# Patient Record
Sex: Female | Born: 1990 | Race: White | Hispanic: Yes | Marital: Single | Smoking: Never smoker
Health system: Southern US, Community
[De-identification: ages and names within clinical notes are randomized; demographics above are authoritative.]

## PROBLEM LIST (undated history)

## (undated) DIAGNOSIS — M1990 Unspecified osteoarthritis, unspecified site: Secondary | ICD-10-CM

## (undated) DIAGNOSIS — F909 Attention-deficit hyperactivity disorder, unspecified type: Secondary | ICD-10-CM

## (undated) DIAGNOSIS — F988 Other specified behavioral and emotional disorders with onset usually occurring in childhood and adolescence: Secondary | ICD-10-CM

## (undated) DIAGNOSIS — Z87820 Personal history of traumatic brain injury: Secondary | ICD-10-CM

## (undated) DIAGNOSIS — N920 Excessive and frequent menstruation with regular cycle: Secondary | ICD-10-CM

## (undated) DIAGNOSIS — N946 Dysmenorrhea, unspecified: Secondary | ICD-10-CM

## (undated) HISTORY — DX: Other specified behavioral and emotional disorders with onset usually occurring in childhood and adolescence: F98.8

---

## 2010-08-21 HISTORY — PX: WISDOM TOOTH EXTRACTION: SHX21

## 2013-08-26 ENCOUNTER — Encounter (HOSPITAL_COMMUNITY): Payer: Self-pay | Admitting: Emergency Medicine

## 2013-08-26 ENCOUNTER — Emergency Department (HOSPITAL_COMMUNITY)
Admission: EM | Admit: 2013-08-26 | Discharge: 2013-08-26 | Disposition: A | Discharge status: Home / Self Care | Payer: 59 | Attending: Emergency Medicine | Admitting: Emergency Medicine

## 2013-08-26 DIAGNOSIS — W268XXA Contact with other sharp object(s), not elsewhere classified, initial encounter: Secondary | ICD-10-CM | POA: Insufficient documentation

## 2013-08-26 DIAGNOSIS — Y929 Unspecified place or not applicable: Secondary | ICD-10-CM | POA: Diagnosis not present

## 2013-08-26 DIAGNOSIS — Y9389 Activity, other specified: Secondary | ICD-10-CM | POA: Insufficient documentation

## 2013-08-26 DIAGNOSIS — T1500XA Foreign body in cornea, unspecified eye, initial encounter: Secondary | ICD-10-CM | POA: Diagnosis present

## 2013-08-26 DIAGNOSIS — T1501XA Foreign body in cornea, right eye, initial encounter: Secondary | ICD-10-CM

## 2013-08-26 MED ORDER — TETRACAINE HCL 0.5 % OP SOLN
1.0000 [drp] | Freq: Once | OPHTHALMIC | Status: DC
Start: 2013-08-26 — End: 2013-08-26
  Filled 2013-08-26: qty 2

## 2013-08-26 MED ORDER — FLUORESCEIN SODIUM 1 MG OP STRP: 1.0000 | ORAL_STRIP | Freq: Once | OPHTHALMIC | Status: DC

## 2013-08-26 NOTE — ED Provider Notes (Signed)
CSN: [id number]     Arrival date & time 08/26/13  0240 History   First MD Initiated Contact with Patient 08/26/13 [phone]     Chief Complaint  Patient presents with  . Foreign Body in Eye   (Consider location/radiation/quality/duration/timing/severity/associated sxs/prior Treatment) HPI Comments: Patient presents with complaint of foreign body in left eye. Patient was grinding metal at approximately 1 AM when she had acute onset of foreign body sensation. Patient rinsed her eye with water several times but FB did not dislodge. Complaints of pain and tearing. It is hard for her to see due to the tearing. The onset of this condition was acute. The course is constant. Aggravating factors: none. Alleviating factors: none.    The history is provided by the patient.    History reviewed. No pertinent past medical history. History reviewed. No pertinent past surgical history. No family history on file. History  Substance Use Topics  . Smoking status: Never Smoker   . Smokeless tobacco: Not on file  . Alcohol Use: No   OB History   No data available     Review of Systems  Constitutional: Negative for fever.  Eyes: Positive for photophobia, pain and discharge. Negative for redness and visual disturbance.  Gastrointestinal: Negative for nausea and vomiting.    Allergies  Review of patient's allergies indicates no known allergies.  Home Medications   Current Outpatient Rx  Name  Route  Sig  Dispense  Refill  . Levonorgestrel-Ethinyl Estradiol (SEASONIQUE) 0.15-0.03 &0.01 MG tablet   Oral   Take 1 tablet by mouth daily.          BP 119/73  Pulse 77  Temp(Src) 97.8 F (36.6 C) (Oral)  Resp 20  Ht 5\' 2"  (1.575 m)  Wt 135 lb (61.236 kg)  BMI 24.69 kg/m2  SpO2 100% Physical Exam  Nursing note and vitals reviewed. Constitutional: She appears well-developed and well-nourished.  HENT:  Head: Normocephalic and atraumatic.  Eyes: Lids are normal. Pupils are equal, round, and  reactive to light. Right eye exhibits no discharge. Left eye exhibits no discharge. Right conjunctiva is injected. Left conjunctiva is not injected. Right eye exhibits normal extraocular motion.  Slit lamp exam:      The right eye shows corneal abrasion, foreign body and fluorescein uptake.       The left eye shows no foreign body and no fluorescein uptake.    Neck: Normal range of motion. Neck supple.  Pulmonary/Chest: No respiratory distress.  Neurological: She is alert.  Skin: Skin is warm and dry.  Psychiatric: She has a normal mood and affect.    ED Course  Procedures (including critical care time) Labs Review Labs Reviewed - No data to display Imaging Review No results found.  EKG Interpretation   None      7:03 AM Patient seen and examined. Medications ordered.   Vital signs reviewed and are as follows: Filed Vitals:   08/26/13 0320  BP: 119/73  Pulse: 77  Temp: 97.8 F (36.6 C)  Resp: 20   Unable to dislodge FB with insulin needle with Wood's Lamp. Will call ophthalmology to obtain follow-up appointment.   8:01 AM Spoke with Dr. Burgess Estelle who will see patient in office today. Pt to call office at 8:30AM.   Patient verbalizes understanding and agrees with plan.     MDM   1. Corneal foreign body with residual material, right, initial encounter    Unable to remove FB here in ED. Neg Seidel. Do not  suspect globe rupture. Will send to ophtho office for removal of FB.    Renne CriglerJoshua Lucretia Pendley, PA-C 08/26/13 [phone]

## 2013-08-26 NOTE — Discharge Instructions (Signed)
Please read and follow all provided instructions.  Your diagnoses today include:  1. Corneal foreign body with residual material, right, initial encounter     Tests performed today include:  Visual acuity testing to check your vision  Fluorescein dye examination to look for scratches on your eye  Vital signs. See below for your results today.   Medications prescribed:   None  Take any prescribed medications only as directed.  Home care instructions:  Call Dr. Huel Coventryanner's office at 8:30am to schedule an appointment for today.   Follow any educational materials contained in this packet. If you wear contact lenses, do not use them until your eye caregiver approves.   If you have an eye infection, wash your hands often as this is very contagious and is easily spread from person to person.   Follow-up instructions: Call Dr. Huel Coventryanner's office at 8:30am to schedule an appointment for today.   If you do not have a primary care doctor -- see below for referral information.   Return instructions:   Please return to the Emergency Department if you experience worsening symptoms.   Please return immediately if you develop severe pain, pus drainage, new change in vision, or fever.  Please return if you have any other emergent concerns.  Additional Information:  Your vital signs today were: BP 119/73   Pulse 77   Temp(Src) 97.8 F (36.6 C) (Oral)   Resp 20   Ht 5\' 2"  (1.575 m)   Wt 135 lb (61.236 kg)   BMI 24.69 kg/m2   SpO2 100% If your blood pressure (BP) was elevated above 135/85 this visit, please have this repeated by your doctor within one month. ---------------  Emergency Department Resource Guide 1) Find a Doctor and Pay Out of Pocket Although you won't have to find out who is covered by your insurance plan, it is a good idea to ask around and get recommendations. You will then need to call the office and see if the doctor you have chosen will accept you as a new patient and  what types of options they offer for patients who are self-pay. Some doctors offer discounts or will set up payment plans for their patients who do not have insurance, but you will need to ask so you aren't surprised when you get to your appointment.  2) Contact Your Local Health Department Not all health departments have doctors that can see patients for sick visits, but many do, so it is worth a call to see if yours does. If you don't know where your local health department is, you can check in your phone book. The CDC also has a tool to help you locate your state's health department, and many state websites also have listings of all of their local health departments.  3) Find a Walk-in Clinic If your illness is not likely to be very severe or complicated, you may want to try a walk in clinic. These are popping up all over the country in pharmacies, drugstores, and shopping centers. They're usually staffed by nurse practitioners or physician assistants that have been trained to treat common illnesses and complaints. They're usually fairly quick and inexpensive. However, if you have serious medical issues or chronic medical problems, these are probably not your best option.  No Primary Care Doctor: - Call Health Connect at  [phone] - they can help you locate a primary care doctor that  accepts your insurance, provides certain services, etc. - Physician Referral Service- [phone]  Chronic  Pain Problems: Organization         Address  Phone   Notes  [facility]  [phone] Patients need to be referred by their primary care doctor.   Medication Assistance: Organization         Address  Phone   Notes  [facility] Medication [facility] [address] [place], [place] [address] [phone] --Must be a resident of [facility] -- Must have NO insurance coverage whatsoever (no Medicaid/ Medicare, etc.) -- The pt. MUST have a primary care doctor  that directs their care regularly and follows them in the community   [facility]  [phone]   [employer]  [phone]    Agencies that provide inexpensive medical care: Organization         Address  Phone   Notes  [facility]  [phone]   [facility]    [phone]   [facility] [address] [place], [place] [address] [phone]   [facility] [address] [place] [phone]   Planned Parenthood    [phone]   [facility]    [phone]   [facility]  [address], [place] Phone:  [phone], Fax:  [phone] Hours of Operation:  9 am - 6 pm, M-F.  Also accepts Medicaid/Medicare and self-pay.  [facility]  [address], [place] Phone: [phone], Fax: [phone]. Hours of Operation:  8:30 am - 5:30 pm, M-F.  Also accepts Medicaid and self-pay.  [facility] [place] [address] [place] Phone: [phone]   [facility] [address] Natasha Bence [place], [place] [phone], Ext. [phone] Mondays & Thursdays: 7-9 AM.  First 15 patients are seen on a first come, first serve basis.    Medicaid-accepting [facility] Providers:  Organization         Address  Phone   Notes  [facility] [address],  [phone] Also accepts self-pay patients.  [facility] [address] Laurell Josephs [place], [place]  [phone]   [facility] [address], [place] [phone]   [facility] [address] [place] [phone]   Renaye Rakers [address], [place]   [phone] Only accepts [place] Access [place] patients after they have their name applied to their card.   Self-Pay (no insurance) in [facility]:  Organization          Address  Phone   Notes  Sickle Cell Patients, [facility] Internal Medicine [address] [place], [place] [phone]   [facility] [address] [place], [place] [phone]   [facility] [place]  [address], [place] [phone]   [facility]/Dr. Osei-Bonsu  [address] [place] or [address], [place] [phone] Phone number for both [place] and [place] locations is the same.  [facility] and [facility] [address] [place] [phone]   [facility] [address] [place] or [address] [phone]   [facility] [address] [phone], phone; [phone], fax Sees patients  1st and 3rd Saturday of every month.  Must not qualify for public or private insurance (i.e. Medicaid, Medicare, [place] Health Choice, Veterans' Benefits)  Household income should be no more than 200% of the poverty level The clinic cannot treat you if you are pregnant or think you are pregnant  Sexually transmitted diseases are not treated at the clinic.    Dental Care: Organization         Address  Phone  Notes  [facility] Department of [facility] [address] [place], [place] [phone] Accepts children up to age 43 who are enrolled in [place] or [place] Health Choice; pregnant women with a Medicaid card; and children who have applied for Medicaid or [place] Health Choice, but were declined, whose parents can pay a reduced fee at time of service.  [facility]  [address], [place] [phone] Accepts children up to age 44 who are enrolled in [place] or [place] Health Choice; pregnant women with a Medicaid card; and children who have applied for Medicaid or [place] Health Choice, but were declined, whose parents can pay a reduced fee at time of  service.  [facility]  [address] [place], [place] [phone] Patients are seen by appointment only. Walk-ins are not accepted. [facility] will see patients 36 years of age and older. Monday - Tuesday (8am-5pm) Most Wednesdays (8:30-5pm) $30 per visit, cash only  [facility] Adult Dental Access PRO[facility]  [address], [facility] [phone] Patients are seen by appointment only. Walk-ins are not accepted. [facility] will see patients 36 years of age and older. One Wednesday Evening (Monthly: Volunteer Based).  $30 per visit, cash only  [employer]  [phone] for adults; Children under age 92, call Graduate Pediatric Dentistry at [phone]. Children aged 42-14, please call [phone] to request a pediatric application.  Dental services are provided in all areas of dental care including fillings, crowns and bridges, complete and partial dentures, implants, gum treatment, root canals, and extractions. Preventive care is also provided. Treatment is provided to both adults and children. Patients are selected via a lottery and there is often a waiting list.   [facility] [address] [place]  [phone] [email or web address]   [facility] [address] [place], [place] [phone], Ext. [phone] Second and Fourth Thursday of each month, opens at 6:30 AM; Clinic ends at 9 AM.  Patients are seen on a first-come first-served basis, and a limited number are seen during each clinic.   [facility]  [address] Ether Griffins [place], [place] [phone]   Eligibility Requirements You must have lived in [place], [place], or [place] counties for at least the last three months.   You cannot be eligible for state or federal sponsored [place], including [employer], [place], or [employer].   You generally cannot be eligible for healthcare insurance through your employer.     How to apply: Eligibility screenings are held every Tuesday and Wednesday afternoon from 1:00 pm until 4:00 pm. You do not need an appointment for the interview!  [facility] [address] [place], [place] [phone]   [facility]  [phone]   [facility]  [phone]   [facility]  [phone]    Behavioral Health Resources in the Community: Intensive Outpatient Programs Organization  Address  Phone  Notes  [facility] [address], [place], [place] [phone]   [facility] Outpatient [address] [place], [place] [phone]   [facility] [address], [place], [place]  [phone]   [facility] Mental Health [address]  [place], [place] [phone] or [phone]   Substance Abuse Resources Organization         Address  Phone  Notes  [facility] Drug Services  [phone]   [facility]  [phone]   [facility]  [phone]   Floydene Flock  [phone]   Residential & Outpatient Substance Abuse Program  [phone]   Psychological Services Organization         Address  Phone  Notes  [facility] Behavioral Health  [phone]   [facility] Services  [phone]   [facility] Mental Health [address] [place] [phone] or [phone]    Mobile Crisis Teams Organization         Address  Phone  Notes  [facility], Mobile Crisis Care Unit  [phone]   [facility]  [address] [place], [place] [phone]   Doristine Locks [address] [place] [phone]    Self-Help/Support Groups Organization         Address  Phone             Notes  [facility] [place] - variety of support groups  336- I7437963 Call for more information  Narcotics Anonymous (NA), [facility] [address]  Dr, [employer] [place]  2 meetings at this location   Statistician         Address  Phone  Notes  [facility] [address],    [place]  [phone]   [facility]  [address], [place] [address], [place], [place] [phone]   [facility] [address] [place], [place] [phone] Admissions: 8am-3pm M-F  [facility] [address]    [place], [place] [phone]   [facility] [address] [place], [place], [place] [phone]   [facility] [address],  [place], [place] [phone]   [facility] - Intensive Outpatient [address] Laurell Josephs [address], [place], [place] [phone]   [facility] ([facility] [address] [place]  [place], [place] [phone] or [phone]   [facility] ([facility]) [address], [place], [place] [phone] Accepts Medicaid  [facility] [address],  [place] [phone] Substance Abuse/Addiction Treatment   [facility] Organization         Address  Phone  Notes  [facility]  [phone]   Angie Fava, PhD [address] Ervin Knack [place], [place]   [phone] or [phone]   [facility] Behavioral   [address] [place], [place] [phone]   [facility] [address], [place], [place] [phone] Insurance/Medicaid/sponsorship through [facility] and Families [address]                                    [place], [place] [phone] Therapy/tele-psych/case  [facility] [address] [place] [phone]    Dr. Lolly Mustache  [phone]   [facility]  [facility] Dept. 1) [address] [place] 2) [address] [place] 3)  [address], [place] [phone]  [phone]   [facility] Child Abuse  Hotline 989-860-1715 or [phone] (After Hours)

## 2013-08-26 NOTE — ED Notes (Signed)
Pt has a piece of metal in left eye, she tried to flush it several times prior to coming to ED

## 2013-09-02 NOTE — ED Provider Notes (Signed)
Medical screening examination/treatment/procedure(s) were performed by non-physician practitioner and as supervising physician I was immediately available for consultation/collaboration.  EKG Interpretation   None         Kevon Tench, MD 09/02/13 0136 

## 2014-06-28 ENCOUNTER — Emergency Department (HOSPITAL_COMMUNITY)
Admission: EM | Admit: 2014-06-28 | Discharge: 2014-06-28 | Disposition: A | Discharge status: Home / Self Care | Payer: No Typology Code available for payment source | Attending: Emergency Medicine | Admitting: Emergency Medicine

## 2014-06-28 ENCOUNTER — Encounter (HOSPITAL_COMMUNITY): Payer: Self-pay | Admitting: Emergency Medicine

## 2014-06-28 ENCOUNTER — Emergency Department (HOSPITAL_COMMUNITY): Payer: No Typology Code available for payment source

## 2014-06-28 DIAGNOSIS — M25571 Pain in right ankle and joints of right foot: Secondary | ICD-10-CM

## 2014-06-28 DIAGNOSIS — S92101A Unspecified fracture of right talus, initial encounter for closed fracture: Secondary | ICD-10-CM

## 2014-06-28 DIAGNOSIS — Y9389 Activity, other specified: Secondary | ICD-10-CM | POA: Insufficient documentation

## 2014-06-28 DIAGNOSIS — Y92410 Unspecified street and highway as the place of occurrence of the external cause: Secondary | ICD-10-CM | POA: Diagnosis not present

## 2014-06-28 DIAGNOSIS — Z79899 Other long term (current) drug therapy: Secondary | ICD-10-CM | POA: Insufficient documentation

## 2014-06-28 DIAGNOSIS — S99911A Unspecified injury of right ankle, initial encounter: Secondary | ICD-10-CM | POA: Insufficient documentation

## 2014-06-28 MED ORDER — NAPROXEN 500 MG PO TABS: 500.0000 mg | ORAL_TABLET | Freq: Two times a day (BID) | ORAL | Status: DC | PRN

## 2014-06-28 MED ORDER — HYDROCODONE-ACETAMINOPHEN 5-325 MG PO TABS: 1.0000 | ORAL_TABLET | Freq: Four times a day (QID) | ORAL | Status: DC | PRN

## 2014-06-28 NOTE — Discharge Instructions (Signed)
Wear ankle splint until you see the orthopedist. Use crutches for all weight bearing activities. Ice and elevate ankle throughout the day. Alternate between naprosyn and norco for pain relief. Do not drive or operate machinery with pain medication use. Call orthopedist follow up today or tomorrow to schedule followup appointment for 1 week. Return to the ER for changes or worsening symptoms.   Ankle Fracture A fracture is a break in a bone. A cast or splint may be used to protect the ankle and heal the break. Sometimes, surgery is needed. HOME CARE  Use crutches as told by your doctor. It is very important that you use your crutches correctly.  Do not put weight or pressure on the injured ankle until told by your doctor.  Keep your ankle raised (elevated) when sitting or lying down.  Apply ice to the ankle:  Put ice in a plastic bag.  Place a towel between your cast and the bag.  Leave the ice on for 20 minutes, 2-3 times a day.  If you have a plaster or fiberglass cast:  Do not try to scratch under the cast with any objects.  Check the skin around the cast every day. You may put lotion on red or sore areas.  Keep your cast dry and clean.  If you have a plaster splint:  Wear the splint as told by your doctor.  You can loosen the elastic around the splint if your toes get numb, tingle, or turn cold or blue.  Do not put pressure on any part of your cast or splint. It may break. Rest your plaster splint or cast only on a pillow the first 24 hours until it is fully hardened.  Cover your cast or splint with a plastic bag during showers.  Do not lower your cast or splint into water.  Take medicine as told by your doctor.  Do not drive until your doctor says it is safe.  Follow-up with your doctor as told. It is very important that you go to your follow-up visits. GET HELP IF: The swelling and discomfort gets worse.  GET HELP RIGHT AWAY IF:   Your splint or cast  breaks.  You continue to have very bad pain.  You have new pain or swelling after your splint or cast was put on.  Your skin or toes below the injured ankle:  Turn blue or gray.  Feel cold, numb, or you cannot feel them.  There is a bad smell or yellowish white fluid (pus) coming from under the splint or cast. MAKE SURE YOU:   Understand these instructions.  Will watch your condition.  Will get help right away if you are not doing well or get worse. Document Released: 06/04/2009 Document Revised: 05/28/2013 Document Reviewed: 03/06/2013 [facility] Patient Information 2015 [place], [place]. This information is not intended to replace advice given to you by your health care provider. Make sure you discuss any questions you have with your health care provider.  Cryotherapy Cryotherapy is when you put ice on your injury. Ice helps lessen pain and puffiness (swelling) after an injury. Ice works the best when you start using it in the first 24 to 48 hours after an injury. HOME CARE  Put a dry or damp towel between the ice pack and your skin.  You may press gently on the ice pack.  Leave the ice on for no more than 10 to 20 minutes at a time.  Check your skin after 5 minutes to  make sure your skin is okay.  Rest at least 20 minutes between ice pack uses.  Stop using ice when your skin loses feeling (numbness).  Do not use ice on someone who cannot tell you when it hurts. This includes small children and people with memory problems (dementia). GET HELP RIGHT AWAY IF:  You have white spots on your skin.  Your skin turns blue or pale.  Your skin feels waxy or hard.  Your puffiness gets worse. MAKE SURE YOU:   Understand these instructions.  Will watch your condition.  Will get help right away if you are not doing well or get worse. Document Released: 01/24/2008 Document Revised: 10/30/2011 Document Reviewed: 03/30/2011 [facility] Patient Information 2015 [place], [place].  This information is not intended to replace advice given to you by your health care provider. Make sure you discuss any questions you have with your health care provider.  Cast or Splint Care Casts and splints support injured limbs and keep bones from moving while they heal.  HOME CARE  Keep the cast or splint uncovered during the drying period.  A plaster cast can take 24 to 48 hours to dry.  A fiberglass cast will dry in less than 1 hour.  Do not rest the cast on anything harder than a pillow for 24 hours.  Do not put weight on your injured limb. Do not put pressure on the cast. Wait for your doctor's approval.  Keep the cast or splint dry.  Cover the cast or splint with a plastic bag during baths or wet weather.  If you have a cast over your chest and belly (trunk), take sponge baths until the cast is taken off.  If your cast gets wet, dry it with a towel or blow dryer. Use the cool setting on the blow dryer.  Keep your cast or splint clean. Wash a dirty cast with a damp cloth.  Do not put any objects under your cast or splint.  Do not scratch the skin under the cast with an object. If itching is a problem, use a blow dryer on a cool setting over the itchy area.  Do not trim or cut your cast.  Do not take out the padding from inside your cast.  Exercise your joints near the cast as told by your doctor.  Raise (elevate) your injured limb on 1 or 2 pillows for the first 1 to 3 days. GET HELP IF:  Your cast or splint cracks.  Your cast or splint is too tight or too loose.  You itch badly under the cast.  Your cast gets wet or has a soft spot.  You have a bad smell coming from the cast.  You get an object stuck under the cast.  Your skin around the cast becomes red or sore.  You have new or more pain after the cast is put on. GET HELP RIGHT AWAY IF:  You have fluid leaking through the cast.  You cannot move your fingers or toes.  Your fingers or toes turn  blue or white or are cool, painful, or puffy (swollen).  You have tingling or lose feeling (numbness) around the injured area.  You have bad pain or pressure under the cast.  You have trouble breathing or have shortness of breath.  You have chest pain. Document Released: 12/07/2010 Document Revised: 04/09/2013 Document Reviewed: 02/13/2013 [facility] Patient Information 2015 [place] CommunityExitCare, [place]LLC. This information is not intended to replace advice given to you by your health care  provider. Make sure you discuss any questions you have with your health care provider. ° °
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
## 2014-07-29 ENCOUNTER — Ambulatory Visit (INDEPENDENT_AMBULATORY_CARE_PROVIDER_SITE_OTHER): Payer: 59 | Admitting: Family Medicine

## 2014-07-29 ENCOUNTER — Encounter: Payer: Self-pay | Admitting: Family Medicine

## 2014-07-29 VITALS — BP 100/64 | HR 70 | Temp 98.1°F | Ht 62.0 in | Wt 147.0 lb

## 2014-07-29 DIAGNOSIS — F988 Other specified behavioral and emotional disorders with onset usually occurring in childhood and adolescence: Secondary | ICD-10-CM

## 2014-07-29 DIAGNOSIS — F909 Attention-deficit hyperactivity disorder, unspecified type: Secondary | ICD-10-CM
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
## 2014-07-29 NOTE — Patient Instructions (Addendum)
Contact us back in about 2 weeks. We should have records by then(stop by front desk and request records from the location you showed).   We will have you back in to talk about ADHD treatment and do a pap smear at that time.

## 2014-07-29 NOTE — Assessment & Plan Note (Signed)
Discussed with patient I would need records of evaluation for ADD before making treatment decision. Additionally, we do not prescribe controlled substances on first visit. Patient will have records sent here and follow up within 2 weeks to make sur ewe have received them. After I have chance to review, patient will schedule for follow up to discuss treatment options. Based on my physical exam today and patient's speech pattern/thought process- i suspect she does have this diagnosis.

## 2014-08-28 ENCOUNTER — Encounter: Payer: 59 | Admitting: Family Medicine

## 2014-08-28 ENCOUNTER — Encounter: Payer: Self-pay | Admitting: Family Medicine

## 2014-08-28 NOTE — Progress Notes (Signed)
  This encounter was created in error - please disregard. Patient was seen but no ADD eval records had been obtained. Need records before visit.  Patient to call in 1 week to check in and no ADD visit until we have these records.

## 2014-09-15 ENCOUNTER — Telehealth: Payer: Self-pay | Admitting: Family Medicine

## 2014-09-15 NOTE — Telephone Encounter (Signed)
This is a patient of Dr. Hunter  

## 2014-09-23 ENCOUNTER — Ambulatory Visit (INDEPENDENT_AMBULATORY_CARE_PROVIDER_SITE_OTHER): Payer: 59 | Admitting: Family Medicine

## 2014-09-23 ENCOUNTER — Encounter: Payer: Self-pay | Admitting: Family Medicine

## 2014-09-23 VITALS — BP 100/80 | Temp 98.3°F | Wt 148.0 lb

## 2014-09-23 DIAGNOSIS — F988 Other specified behavioral and emotional disorders with onset usually occurring in childhood and adolescence: Secondary | ICD-10-CM

## 2014-09-23 DIAGNOSIS — F909 Attention-deficit hyperactivity disorder, unspecified type: Secondary | ICD-10-CM

## 2014-09-23 MED ORDER — AMPHETAMINE-DEXTROAMPHETAMINE 5 MG PO TABS: 5.0000 mg | ORAL_TABLET | Freq: Every day | ORAL | Status: DC | PRN

## 2014-09-23 MED ORDER — AMPHETAMINE-DEXTROAMPHET ER 10 MG PO CP24: 10.0000 mg | ORAL_CAPSULE | Freq: Every day | ORAL | Status: DC

## 2014-09-23 NOTE — Assessment & Plan Note (Signed)
Former diagnosed by Baristapscychologist. Previously controlled without medications. At least in short run, patient in need of medication to help her finish her masters. We opted for a long acting adderall to get her through the bulk of schedule at 10mg  with 5mg  short acting to help her for nights she has to work longer. She will follow up in 1 month for further titration if needed. If stable, would give 3 month rx. We discussed with long term rx would need drug testing and controlled substance contract.

## 2014-09-23 NOTE — Patient Instructions (Addendum)
Follow up in 1 month. We reviewed potential side effects noted below. Try adderall 10mg  extended release each day around noon. Up to 3x a week you may add a dose of medicine at least 8 hours after the first dose for the nights you need to work to 5 am.   Adverse Reactions Significant  Frequency not always defined.  Cardiovascular: Systolic hypertension (extended release; adolescents: 12% to 35%; dose related; transient), tachycardia (extended release; adults: ?6%), palpitations (extended release: 2% to 4%), increased blood pressure, myocardial infarction, Raynaud's phenomenon Central nervous system: Insomnia (extended release: 12% to 27%), headache (extended release; adults: ?26%), emotional lability (extended release: 2% to 9%), anxiety (extended release; adults: 8%), agitation (extended release; adults: ?8%), dizziness (extended release: 2% to 7%), nervousness (extended release: 6%), drowsiness (extended release: 2% to 4%), speech disturbance (extended release: 2% to 4%), twitching (extended release: 2% to 4%), aggressive behavior, depression, dysphoria, euphoria, exacerbation of vocal tics, formication, irritability, outbursts of anger, overstimulation, paresthesia, psychosis, restlessness, talkativeness Dermatologic: Diaphoresis (extended release: 2% to 4%), skin photosensitivity (extended release: 2% to 4%), alopecia, dermatillomania, skin rash, urticaria  Endocrine & metabolic: Weight loss (extended release: 4% to 10%), decreased libido (extended release: 2% to 4%), dysmenorrhea (extended release: 2% to 4%)  Gastrointestinal: Decreased appetite (extended release: 22% to 36%), xerostomia (extended release: 2% to 35%), abdominal pain (extended release: 11% to 14%), nausea (extended release: 2% to 8%), vomiting (extended release: 2% to 7%), diarrhea (extended release: 2% to 6%), constipation (extended release: 2% to 4%), dyspepsia (extended release: 2% to 4%), teeth clenching (extended release: ?4%),  tooth infection (extended release: ?4%), anorexia (extended release: 2%), bruxism, unpleasant taste  Genitourinary: Urinary tract infection (extended release: 5%), impotence (extended release: 2% to 4%), frequent erections, prolonged erections  Hypersensitivity: Anaphylaxis, angioedema, hypersensitivity reaction Infection: Infection (extended release: 2% to 4%)  Neuromuscular & skeletal: Dyskinesia, rhabdomyolysis, tremor  Ophthalmic: Blurred vision, mydriasis  Respiratory: Dyspnea (extended release: 2% to 4%)  Miscellaneous: Fever (extended release: 5%)  <1% (Limited to important or life-threatening): Cardiomyopathy, cerebrovascular accident, Gilles de la Tourette's syndrome (exacerbation), peripheral vascular disease, seizure, toxic epidermal necrolysis

## 2014-09-23 NOTE — Progress Notes (Signed)
  Laura ConchStephen Bisma Klett, MD Phone: [phone]  Subjective:   Laura King is a 24 y.o. year old very pleasant female patient who presents with the following:  ADD follow up Records reviewed and discussed with patient. Diagnosed 05/12/2008 at [facility] by Dr. Jonah Bluehip Studwell of pscyhology. Also noted auditory discrimination issues as well as GAD. Family history of ADD with Siblings taking vyvanse 30mg  and concerta 100mg . Patient's symptoms managed without medication until could not exercise. She is working on her masters at [employer]. 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 She has noticed increasing difficulty staying focused on her work after a leg injury which has limited exercise   ROS- no palpitations, chest pain. No history BP elevations. No loss of appetite or weight loss.   Past Medical History- Patient Active Problem List   Diagnosis Date Noted  . ADD (attention deficit disorder)    Medications- None  Objective: BP 100/80 mmHg  Temp(Src) 98.3 F (36.8 C)  Wt 148 lb (67.132 kg) Gen: NAD, resting comfortably CV: RRR no murmurs rubs or gallops Lungs: CTAB no crackles, wheeze, rhonchi Abdomen: soft/nontender/nondistended/normal bowel sounds.  Ext: no edema Skin: warm, dry, no rash Neuro: grossly normal, moves all extremities, slightly antalgic gait from prior leg injury   Assessment/Plan:  ADD (attention deficit disorder) Former diagnosed by pscychologist. Previously controlled without medications. At least in short run, patient in need of medication to help her finish her masters. We opted for a long acting adderall to get her through the bulk of schedule at 10mg  with 5mg  short acting to help her for nights she has to work longer. She will follow up in 1 month for further titration if needed. If stable, would give 3 month rx. We discussed with long term rx would need drug  testing and controlled substance contract.     Return precautions advised and SE discussed.   Meds ordered this encounter  Medications  . amphetamine-dextroamphetamine (ADDERALL XR) 10 MG 24 hr capsule    Sig: Take 1 capsule (10 mg total) by mouth daily. At least 30 minutes before going to school    Dispense:  30 capsule    Refill:  0  . amphetamine-dextroamphetamine (ADDERALL) 5 MG tablet    Sig: Take 1 tablet by mouth daily as needed (8 hours after extended release. 3x a week maximum.).    Dispense:  12 tablet    Refill:  0

## 2014-10-19 ENCOUNTER — Ambulatory Visit (INDEPENDENT_AMBULATORY_CARE_PROVIDER_SITE_OTHER): Payer: 59 | Admitting: Family Medicine

## 2014-10-19 ENCOUNTER — Encounter: Payer: Self-pay | Admitting: Family Medicine

## 2014-10-19 VITALS — BP 122/80 | Temp 98.3°F | Wt 145.0 lb

## 2014-10-19 DIAGNOSIS — Z23 Encounter for immunization: Secondary | ICD-10-CM

## 2014-10-19 DIAGNOSIS — F909 Attention-deficit hyperactivity disorder, unspecified type: Secondary | ICD-10-CM

## 2014-10-19 DIAGNOSIS — F988 Other specified behavioral and emotional disorders with onset usually occurring in childhood and adolescence: Secondary | ICD-10-CM

## 2014-10-19 MED ORDER — AMPHETAMINE-DEXTROAMPHETAMINE 5 MG PO TABS: 10.0000 mg | ORAL_TABLET | Freq: Every day | ORAL | Status: DC | PRN

## 2014-10-19 MED ORDER — AMPHETAMINE-DEXTROAMPHET ER 20 MG PO CP24: 20.0000 mg | ORAL_CAPSULE | Freq: Every day | ORAL | Status: DC

## 2014-10-19 NOTE — Patient Instructions (Signed)
Follow up in 1 month.  Try adderall 20mg  extended release each day around noon. Up to 3x a week you may add a dose of medicine (up to 10mg ) at least 8 hours after the first dose for the nights you need to work to 5 am.

## 2014-10-19 NOTE — Assessment & Plan Note (Signed)
Once again-At least in short run, patient in need of medication to help her finish her masters. We opted to increase main dose to 20mg  extended release adderall. We will also increase 5mg  short acting to 10mg  to help her for nights she has to work longer. She will follow up in 1 month for further titration if needed. If stable, would give 3 month rx. with long term rx she would need drug testing and controlled substance contract.

## 2014-10-19 NOTE — Progress Notes (Signed)
  Laura ConchStephen Hunter, MD Phone: [phone]  Subjective:   Laura King is a 24 y.o. year old very pleasant female patient who presents with the following:  ADD follow up History: She is working on her masters at [employer]. 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 5 mg 3x a week 5mg  prescribed at last visit.   Today, Patient states this has been helpful but not as helpful as she has hoped. 5mg  pills have not helped at all. She has gotten good feedback from [employer] about productivity . She is exercising again which helps as well.    ROS- no palpitations, chest pain. No history BP elevations. No loss of appetite or weight loss. States weight loss today due to wearing much lighter clothes.   Past Medical History-ADD  Medications-  Current Outpatient Prescriptions  Medication Sig Dispense Refill  . amphetamine-dextroamphetamine (ADDERALL XR) 10 MG 24 hr capsule Take 1 capsule (10 mg total) by mouth daily. At least 30 minutes before going to school 30 capsule 0  . amphetamine-dextroamphetamine (ADDERALL) 5 MG tablet Take 1 tablets by mouth daily as needed (8 hours after extended release. 3x a week maximum.). 24 tablet 0   Objective: BP 122/80 mmHg  Temp(Src) 98.3 F (36.8 C)  Wt 145 lb (65.772 kg) Gen: NAD, resting comfortably CV: RRR no murmurs rubs or gallops Lungs: CTAB no crackles, wheeze, rhonchi Abdomen: soft/nontender/nondistended/normal bowel sounds.  Ext: no edema Skin: warm, dry, no rash  Assessment/Plan:  ADD (attention deficit disorder) Once again-At least in short run, patient in need of medication to help her finish her masters. We opted to increase main dose to 20mg  extended release adderall. We will also increase 5mg  short acting to 10mg  to help her for nights she has to work longer. She will follow up in 1 month  for further titration if needed. If stable, would give 3 month rx. with long term rx she would need drug testing and controlled substance contract.      1 month follow up with PAP smear at that time. We also discussed need for continued exercise to help with her being overweight.   Meds ordered this encounter  Medications  . amphetamine-dextroamphetamine (ADDERALL XR) 20 MG 24 hr capsule    Sig: Take 1 capsule (20 mg total) by mouth daily. At least 30 minutes before going to school    Dispense:  30 capsule    Refill:  0  . amphetamine-dextroamphetamine (ADDERALL) 5 MG tablet    Sig: Take 2 tablets by mouth daily as needed (8 hours after extended release. 3x a week maximum.).    Dispense:  24 tablet    Refill:  0

## 2014-11-16 ENCOUNTER — Ambulatory Visit (INDEPENDENT_AMBULATORY_CARE_PROVIDER_SITE_OTHER): Payer: 59 | Admitting: Family Medicine

## 2014-11-16 ENCOUNTER — Encounter: Payer: Self-pay | Admitting: Family Medicine

## 2014-11-16 ENCOUNTER — Other Ambulatory Visit (HOSPITAL_COMMUNITY)
Admission: RE | Admit: 2014-11-16 | Discharge: 2014-11-16 | Disposition: A | Discharge status: Home / Self Care | Payer: 59 | Source: Ambulatory Visit | Attending: Family Medicine | Admitting: Family Medicine

## 2014-11-16 VITALS — BP 118/80 | HR 67 | Temp 98.2°F | Wt 145.0 lb

## 2014-11-16 DIAGNOSIS — Z0000 Encounter for general adult medical examination without abnormal findings: Secondary | ICD-10-CM

## 2014-11-16 DIAGNOSIS — Z01419 Encounter for gynecological examination (general) (routine) without abnormal findings: Secondary | ICD-10-CM | POA: Diagnosis not present

## 2014-11-16 DIAGNOSIS — Z803 Family history of malignant neoplasm of breast: Secondary | ICD-10-CM | POA: Insufficient documentation

## 2014-11-16 DIAGNOSIS — F909 Attention-deficit hyperactivity disorder, unspecified type: Secondary | ICD-10-CM

## 2014-11-16 DIAGNOSIS — N926 Irregular menstruation, unspecified: Secondary | ICD-10-CM

## 2014-11-16 DIAGNOSIS — N760 Acute vaginitis: Secondary | ICD-10-CM | POA: Insufficient documentation

## 2014-11-16 DIAGNOSIS — Z113 Encounter for screening for infections with a predominantly sexual mode of transmission: Secondary | ICD-10-CM | POA: Insufficient documentation

## 2014-11-16 DIAGNOSIS — F988 Other specified behavioral and emotional disorders with onset usually occurring in childhood and adolescence: Secondary | ICD-10-CM

## 2014-11-16 HISTORY — DX: Irregular menstruation, unspecified: N92.6

## 2014-11-16 HISTORY — DX: Family history of malignant neoplasm of breast: Z80.3

## 2014-11-16 LAB — COMPREHENSIVE METABOLIC PANEL
ALBUMIN: 4.4 g/dL (ref 3.5–5.2)
ALT: 11 U/L (ref 0–35)
AST: 16 U/L (ref 0–37)
Alkaline Phosphatase: 49 U/L (ref 39–117)
BUN: 10 mg/dL (ref 6–23)
CALCIUM: 10 mg/dL (ref 8.4–10.5)
CHLORIDE: 102 meq/L (ref 96–112)
CO2: 32 meq/L (ref 19–32)
Creatinine, Ser: 0.77 mg/dL (ref 0.40–1.20)
GFR: 96.66 mL/min (ref >60.00)
GLUCOSE: 88 mg/dL (ref 70–99)
Potassium: 4.2 mEq/L (ref 3.5–5.1)
SODIUM: 139 meq/L (ref 135–145)
TOTAL PROTEIN: 7.4 g/dL (ref 6.0–8.3)
Total Bilirubin: 0.5 mg/dL (ref 0.2–1.2)

## 2014-11-16 LAB — CBC
HCT: 41.2 % (ref 36.0–46.0)
HEMOGLOBIN: 14.1 g/dL (ref 12.0–15.0)
MCHC: 34.4 g/dL (ref 30.0–36.0)
MCV: 86.9 fl (ref 78.0–100.0)
PLATELETS: 229 10*3/uL (ref 150.0–400.0)
RBC: 4.74 Mil/uL (ref 3.87–5.11)
RDW: 12.1 % (ref 11.5–15.5)
WBC: 5.3 10*3/uL (ref 4.0–10.5)

## 2014-11-16 LAB — LDL CHOLESTEROL, DIRECT: Direct LDL: 81 mg/dL

## 2014-11-16 LAB — POCT URINE PREGNANCY: PREG TEST UR: NEGATIVE

## 2014-11-16 LAB — TSH: TSH: 1.51 u[IU]/mL (ref 0.35–4.50)

## 2014-11-16 MED ORDER — AMPHETAMINE-DEXTROAMPHETAMINE 5 MG PO TABS: 10.0000 mg | ORAL_TABLET | Freq: Every day | ORAL | Status: DC | PRN

## 2014-11-16 MED ORDER — LEVONORGEST-ETH ESTRAD 91-DAY 0.15-0.03 &0.01 MG PO TABS: 1.0000 | ORAL_TABLET | Freq: Every day | ORAL | Status: DC

## 2014-11-16 MED ORDER — AMPHETAMINE-DEXTROAMPHET ER 20 MG PO CP24
20.0000 mg | ORAL_CAPSULE | Freq: Every day | ORAL | Status: DC
Start: 2014-11-16 — End: 2014-11-16

## 2014-11-16 MED ORDER — AMPHETAMINE-DEXTROAMPHET ER 20 MG PO CP24: 20.0000 mg | ORAL_CAPSULE | Freq: Every day | ORAL | Status: DC

## 2014-11-16 NOTE — Assessment & Plan Note (Signed)
Doing well on Adderall 20mg  extended release with additional 10 mg short acting up to 3 times a week on nights she has to work longer. Stable so 3 months Rx given. Follow-up in office in 6 months. Consider controlled substance contract.
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
## 2014-11-16 NOTE — Patient Instructions (Addendum)
Urine pregnancy today. Seasonique if negative. I do think you need birth control to regulate you. Increased risk of endometrial cancer not on this.   I would start breast cancer screening age 24 with family history  Update all bloodwork as well.   In person visit required 6 months. ADD stable

## 2014-11-16 NOTE — Assessment & Plan Note (Signed)
Not clear if ovulating. Urine pregnancy negative. To avoid unopposed estrogen we will restart [facility]

## 2014-11-17 LAB — HIV ANTIBODY (ROUTINE TESTING W REFLEX): HIV 1&2 Ab, 4th Generation: NONREACTIVE

## 2014-11-17 LAB — RPR

## 2014-11-18 LAB — CYTOLOGY - PAP

## 2014-11-18 NOTE — Addendum Note (Signed)
Addended by: Rita OharaHRASHER, Add Dinapoli R on: 11/18/2014 01:20 PM   Modules accepted: Orders

## 2014-11-20 LAB — CERVICOVAGINAL ANCILLARY ONLY: Bacterial vaginitis: NEGATIVE

## 2015-02-05 ENCOUNTER — Telehealth: Payer: Self-pay | Admitting: Family Medicine

## 2015-02-05 MED ORDER — AMPHETAMINE-DEXTROAMPHET ER 20 MG PO CP24: 20.0000 mg | ORAL_CAPSULE | Freq: Every day | ORAL | Status: DC

## 2015-02-05 MED ORDER — AMPHETAMINE-DEXTROAMPHETAMINE 5 MG PO TABS: 10.0000 mg | ORAL_TABLET | Freq: Every day | ORAL | Status: DC | PRN

## 2015-02-05 NOTE — Telephone Encounter (Signed)
Rx up front for pt

## 2015-02-05 NOTE — Telephone Encounter (Signed)
Ok to refill 

## 2015-02-05 NOTE — Telephone Encounter (Signed)
Yes with first fill date 6/28. 3 months rx for both. Thanks

## 2015-02-05 NOTE — Telephone Encounter (Signed)
Pt needs new rxs generic adderall xr 20 mg and 5 mg

## 2015-04-15 ENCOUNTER — Telehealth: Payer: Self-pay

## 2015-04-15 NOTE — Telephone Encounter (Signed)
See below

## 2015-04-15 NOTE — Telephone Encounter (Signed)
She is in the process of finding a new company, she wont lose her current insurance until the end of sept so they will fill the Rx that she has for this month and she is aware of her 57month f/u visit in about a month.

## 2015-04-15 NOTE — Telephone Encounter (Signed)
The pt called and is needing to switch her adderall rx from extended release to the regular rx. The switch is due to her needing to change insurance companies, which does not cover the extended release.   Pt callback - [phone]

## 2015-04-15 NOTE — Telephone Encounter (Signed)
Has she already changed insurance? Will they not fill the last rx from 04/18/15? If not, she needs to bring these prescriptions to [place] so we can shred them or we need to verify that pharmacy has discarded rx.   Then, we can write for adderall not extended release at  3x a day prn #90 for 1 month and discontinue all previous prescriptions. She is due for 6 month visit in a month regardless.
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
## 2015-05-27 ENCOUNTER — Ambulatory Visit: Payer: 59 | Admitting: Family Medicine

## 2015-06-03 ENCOUNTER — Ambulatory Visit (INDEPENDENT_AMBULATORY_CARE_PROVIDER_SITE_OTHER): Payer: 59 | Admitting: Family Medicine

## 2015-06-03 ENCOUNTER — Encounter: Payer: Self-pay | Admitting: Family Medicine

## 2015-06-03 VITALS — BP 120/70 | HR 83 | Temp 98.5°F | Wt 144.0 lb

## 2015-06-03 DIAGNOSIS — F909 Attention-deficit hyperactivity disorder, unspecified type: Secondary | ICD-10-CM

## 2015-06-03 DIAGNOSIS — F988 Other specified behavioral and emotional disorders with onset usually occurring in childhood and adolescence: Secondary | ICD-10-CM

## 2015-06-03 DIAGNOSIS — N926 Irregular menstruation, unspecified: Secondary | ICD-10-CM

## 2015-06-03 MED ORDER — AMPHETAMINE-DEXTROAMPHETAMINE 5 MG PO TABS
10.0000 mg | ORAL_TABLET | Freq: Every day | ORAL | Status: DC | PRN
Start: 2015-06-03 — End: 2015-06-03

## 2015-06-03 MED ORDER — AMPHETAMINE-DEXTROAMPHETAMINE 20 MG PO TABS: ORAL_TABLET | ORAL | Status: DC

## 2015-06-03 MED ORDER — AMPHETAMINE-DEXTROAMPHETAMINE 5 MG PO TABS: 10.0000 mg | ORAL_TABLET | Freq: Every day | ORAL | Status: DC | PRN

## 2015-06-03 MED ORDER — AMPHETAMINE-DEXTROAMPHET ER 20 MG PO CP24: 20.0000 mg | ORAL_CAPSULE | Freq: Every day | ORAL | Status: DC

## 2015-06-03 MED ORDER — AMPHETAMINE-DEXTROAMPHET ER 20 MG PO CP24
20.0000 mg | ORAL_CAPSULE | Freq: Every day | ORAL | Status: DC
Start: 2015-06-03 — End: 2015-06-03

## 2015-06-03 NOTE — Assessment & Plan Note (Signed)
S: controlled. Insurance does not cover XL anymore though.  A/P:Continue current meds in form of aderrall but change to short acting 20mg  with 10mg  boost in 8 hours if needed. May shorten interval to 6 hours if needed at follow up. Suspect may need this.

## 2015-06-03 NOTE — Assessment & Plan Note (Signed)
S: tolerating seasonique. Avoiding unopposed estrogen A/P: continue current rx

## 2015-06-03 NOTE — Progress Notes (Signed)
Tana ConchStephen Hayleen Clinkscales, MD  Subjective:  Laura RougeKristin Coggins is a 24 y.o. year old very pleasant female patient who presents for/with See problem oriented charting ROS- no chest pain or shortness of breath No SI or HI No unintentional weight loss or food avoidance  Past Medical History-  Patient Active Problem List   Diagnosis Date Noted  . ADD (attention deficit disorder)     Priority: Medium  . Irregular periods/menstrual cycles 11/16/2014    Priority: Low  . Family history of breast cancer 11/16/2014    Priority: Low    Medications- reviewed and updated Current Outpatient Prescriptions  Medication Sig Dispense Refill  . Levonorgestrel-Ethinyl Estradiol (AMETHIA,CAMRESE) 0.15-0.03 &0.01 MG tablet Take 1 tablet by mouth daily. 1 Package 4  . amphetamine-dextroamphetamine (ADDERALL) 20 MG tablet  NEW plan-  Take full tab in AM before work, may take 1/2 tab 8 hours later as needed. Fill now. 45 tablet 0  . amphetamine-dextroamphetamine (ADDERALL) 20 MG tablet Take full tab in AM before work, may take 1/2 tab 8 hours later as needed. Fill in 1 month 45 tablet 0  . amphetamine-dextroamphetamine (ADDERALL) 20 MG tablet Take full tab in AM before work, may take 1/2 tab 8 hours later as needed. Fill in two months 45 tablet 0   No current facility-administered medications for this visit.    Objective: BP 120/70 mmHg  Pulse 83  Temp(Src) 98.5 F (36.9 C)  Wt 144 lb (65.318 kg) Gen: NAD, resting comfortably CV: RRR no murmurs rubs or gallops Lungs: CTAB no crackles, wheeze, rhonchi Abdomen: soft/nontender/nondistended/normal bowel sounds. No rebound or guarding.  Ext: no edema Skin: warm, dry Neuro: grossly normal, moves all extremities  Assessment/Plan:  ADD (attention deficit disorder) S: controlled. Insurance does not cover XL anymore though.  A/P:Continue current meds in form of aderrall but change to short acting 20mg  with 10mg  boost in 8 hours if needed. May shorten interval to 6  hours if needed at follow up. Suspect may need this.    Irregular periods/menstrual cycles S: tolerating seasonique. Avoiding unopposed estrogen A/P: continue current rx   6 months. Plan for controlled substance contract at that time.    Shredded discontinued meds- patient reminded me of insurance change after we had already filled Meds ordered this encounter  . amphetamine-dextroamphetamine (ADDERALL) 20 MG tablet    Sig: Take full tab in AM before work, may take 1/2 tab 8 hours later as needed. Fill now.    Dispense:  45 tablet    Refill:  0  . amphetamine-dextroamphetamine (ADDERALL) 20 MG tablet    Sig: Take full tab in AM before work, may take 1/2 tab 8 hours later as needed. Fill in 1 month    Dispense:  45 tablet    Refill:  0  . amphetamine-dextroamphetamine (ADDERALL) 20 MG tablet    Sig: Take full tab in AM before work, may take 1/2 tab 8 hours later as needed. Fill in two months    Dispense:  45 tablet    Refill:  0

## 2015-06-03 NOTE — Patient Instructions (Signed)
Continue seasonique- may change to nexplanon implant in arm- but likely would need to refer to GYN  Refilled ADD meds for 3 months. Call in 3 months. See me in office in 6 months- will try to fill out controlled substance contract at that time

## 2015-09-29 ENCOUNTER — Ambulatory Visit (INDEPENDENT_AMBULATORY_CARE_PROVIDER_SITE_OTHER): Payer: BLUE CROSS/BLUE SHIELD | Admitting: Family Medicine

## 2015-09-29 ENCOUNTER — Encounter: Payer: Self-pay | Admitting: Family Medicine

## 2015-09-29 ENCOUNTER — Telehealth: Payer: Self-pay | Admitting: *Deleted

## 2015-09-29 VITALS — BP 100/62 | HR 77 | Temp 98.2°F | Ht 62.0 in | Wt 143.2 lb

## 2015-09-29 DIAGNOSIS — N926 Irregular menstruation, unspecified: Secondary | ICD-10-CM | POA: Diagnosis not present

## 2015-09-29 DIAGNOSIS — R300 Dysuria: Secondary | ICD-10-CM | POA: Diagnosis not present

## 2015-09-29 LAB — POC URINALSYSI DIPSTICK (AUTOMATED)
BILIRUBIN UA: NEGATIVE
Blood, UA: NEGATIVE
Glucose, UA: NEGATIVE
Ketones, UA: 5
LEUKOCYTES UA: NEGATIVE
NITRITE UA: NEGATIVE
PH UA: 6
PROTEIN UA: NEGATIVE
Spec Grav, UA: 1.02
Urobilinogen, UA: 0.2

## 2015-09-29 LAB — POCT URINE PREGNANCY: PREG TEST UR: NEGATIVE

## 2015-09-29 NOTE — Telephone Encounter (Signed)
Patient left the office after giving a urine specimen and we were not able to go over the results with her.  Per Dr Selena Batten I called the pt and informed her the urine test shows she needs to drink more water, increase her intake of fluids and if she is still having any problems in a week to make an appt to see Dr Durene Cal.  I also informed her Dr Selena Batten ordered a urine pregnancy test due to irregular periods and this was negative and she agreed.

## 2015-09-29 NOTE — Progress Notes (Signed)
Pre visit review using our clinic review tool, if applicable. No additional management support is needed unless otherwise documented below in the visit note. 
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
## 2015-10-01 LAB — URINE CULTURE

## 2015-10-04 ENCOUNTER — Telehealth: Payer: Self-pay | Admitting: Family Medicine

## 2015-10-04 NOTE — Telephone Encounter (Signed)
Pt request refill of the following: amphetamine-dextroamphetamine (ADDERALL) 20 MG tablet ° ° °Phamacy: ° °

## 2015-10-05 ENCOUNTER — Other Ambulatory Visit: Payer: Self-pay

## 2015-10-05 MED ORDER — AMPHETAMINE-DEXTROAMPHETAMINE 20 MG PO TABS: ORAL_TABLET | ORAL | Status: DC

## 2015-10-05 NOTE — Telephone Encounter (Signed)
Rx up front for pt, pt notified and aware to schedule f/u OV before next refill.

## 2015-10-05 NOTE — Telephone Encounter (Signed)
May fill for 3 months, schedule office visit before runs out next time

## 2016-04-07 ENCOUNTER — Ambulatory Visit (INDEPENDENT_AMBULATORY_CARE_PROVIDER_SITE_OTHER): Payer: BLUE CROSS/BLUE SHIELD | Admitting: Family Medicine

## 2016-04-07 ENCOUNTER — Encounter: Payer: Self-pay | Admitting: Family Medicine

## 2016-04-07 DIAGNOSIS — N926 Irregular menstruation, unspecified: Secondary | ICD-10-CM | POA: Diagnosis not present

## 2016-04-07 DIAGNOSIS — F909 Attention-deficit hyperactivity disorder, unspecified type: Secondary | ICD-10-CM

## 2016-04-07 DIAGNOSIS — F988 Other specified behavioral and emotional disorders with onset usually occurring in childhood and adolescence: Secondary | ICD-10-CM

## 2016-04-07 MED ORDER — AMPHETAMINE-DEXTROAMPHET ER 20 MG PO CP24: 20.0000 mg | ORAL_CAPSULE | Freq: Every day | ORAL | 0 refills | Status: DC

## 2016-04-07 MED ORDER — AMPHETAMINE-DEXTROAMPHETAMINE 10 MG PO TABS: ORAL_TABLET | ORAL | 0 refills | Status: DC

## 2016-04-07 NOTE — Progress Notes (Signed)
Pre visit review using our clinic review tool, if applicable. No additional management support is needed unless otherwise documented below in the visit note. 

## 2016-04-07 NOTE — Progress Notes (Signed)
Subjective:  Laura King is a 25 y.o. year old very pleasant female patient who presents for/with See problem oriented charting ROS- No chest pain or shortness of breath. No headache or blurry vision.  No unintentional weight loss.see any ROS included in HPI as well.   Past Medical History-  Patient Active Problem List   Diagnosis Date Noted  . ADD (attention deficit disorder)     Priority: Medium  . Irregular periods/menstrual cycles 11/16/2014    Priority: Low  . Family history of breast cancer 11/16/2014    Priority: Low    Medications- reviewed and updated Current Outpatient Prescriptions  Medication Sig Dispense Refill  . amphetamine-dextroamphetamine (ADDERALL) 20 MG tablet Take full tab in AM before work, may take 1/2 tab 8 hours later as needed. Fill now. 45 tablet 0  . amphetamine-dextroamphetamine (ADDERALL) 20 MG tablet Take full tab in AM before work, may take 1/2 tab 8 hours later as needed. Fill in 1 month 45 tablet 0  . amphetamine-dextroamphetamine (ADDERALL) 20 MG tablet Take full tab in AM before work, may take 1/2 tab 8 hours later as needed. May fill 12/03/15. 45 tablet 0  . etonogestrel (IMPLANON) 68 MG IMPL implant 1 each by Subdermal route once.    . NON FORMULARY 5 HTP (serotonin supplement per patient)     Objective: BP 120/66   Pulse 62   Temp 98.9 F (37.2 C) (Oral)   Ht 5\' 2"  (1.575 m)   Wt 144 lb 8 oz (65.5 kg)   SpO2 99%   BMI 26.43 kg/m  Gen: NAD, resting comfortably CV: RRR no murmurs rubs or gallops Lungs: CTAB no crackles, wheeze, rhonchi Abdomen: soft/nontender/nondistended/normal bowel sounds.  Ext: no edema Skin: warm, dry Neuro: grossly normal, moves all extremities  Assessment/Plan:  ADD (attention deficit disorder) S: took some time off meds in summer. 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 She preferred the XL regimen- seemed to be more effective for her.  A/P: Controlled substance contract signed.  We will restart 20mg  adderall XR on daily basis. 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   We also discussed improved irregular periods on nexplanon.   6 months in person  Meds ordered this encounter  Medications  . amphetamine-dextroamphetamine (ADDERALL) 10 MG tablet    Sig: Take once daily as needed (8 hours after extended release). 4x a week maximum. Fill now.    Dispense:  16 tablet    Refill:  0  . amphetamine-dextroamphetamine (ADDERALL) 10 MG tablet    Sig: Take once daily as needed (8 hours after extended release). 4x a week maximum. Fill in 1 month.    Dispense:  16 tablet    Refill:  0  . amphetamine-dextroamphetamine (ADDERALL) 10 MG tablet    Sig: Take once daily as needed (8 hours after extended release). 4x a week maximum. Fill in 2 months.    Dispense:  16 tablet    Refill:  0  . amphetamine-dextroamphetamine (ADDERALL XR) 20 MG 24 hr capsule    Sig: Take 1 capsule (20 mg total) by mouth daily. Fill now.    Dispense:  30 capsule    Refill:  0  . amphetamine-dextroamphetamine (ADDERALL XR) 20 MG 24 hr capsule    Sig: Take 1 capsule (20 mg total) by mouth daily. Fill in 1 month.    Dispense:  30 capsule    Refill:  0  .  amphetamine-dextroamphetamine (ADDERALL XR) 20 MG 24 hr capsule    Sig: Take 1 capsule (20 mg total) by mouth daily. Fill in 2 months    Dispense:  30 capsule    Refill:  0   The duration of face-to-face time during this visit was 15 minutes. Greater than 50% of this time was spent in counseling, explanation of diagnosis, planning of further management, and/or coordination of care.    Return precautions advised.  Tana ConchStephen Hunter, MD

## 2016-04-07 NOTE — Patient Instructions (Signed)
Call in 3 months for refill, see you in 6 months

## 2016-04-08 NOTE — Assessment & Plan Note (Signed)
S: took some time off meds in summer. 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 She preferred the XL regimen- seemed to be more effective for her.  A/P: Controlled substance contract signed. We will restart 20mg  adderall XR on daily basis. 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

## 2016-07-06 ENCOUNTER — Other Ambulatory Visit: Payer: Self-pay

## 2016-07-06 ENCOUNTER — Telehealth: Payer: Self-pay | Admitting: Family Medicine

## 2016-07-06 MED ORDER — AMPHETAMINE-DEXTROAMPHETAMINE 10 MG PO TABS: ORAL_TABLET | ORAL | 0 refills | Status: DC

## 2016-07-06 MED ORDER — AMPHETAMINE-DEXTROAMPHET ER 20 MG PO CP24: 20.0000 mg | ORAL_CAPSULE | Freq: Every day | ORAL | 0 refills | Status: DC

## 2016-07-06 NOTE — Telephone Encounter (Signed)
Pt request refill  amphetamine-dextroamphetamine (ADDERALL XR) 20 MG 24 hr capsule amphetamine-dextroamphetamine (ADDERALL) 10 MG tablet

## 2016-07-06 NOTE — Telephone Encounter (Signed)
Called and left a voicemail message letting her know that her prescription was ready for pick up at the front desk

## 2016-10-23 ENCOUNTER — Telehealth: Payer: Self-pay | Admitting: Family Medicine

## 2016-10-23 NOTE — Telephone Encounter (Signed)
Error/ltd ° °

## 2016-10-24 ENCOUNTER — Encounter: Payer: Self-pay | Admitting: Family Medicine

## 2016-10-24 ENCOUNTER — Ambulatory Visit (INDEPENDENT_AMBULATORY_CARE_PROVIDER_SITE_OTHER): Payer: BLUE CROSS/BLUE SHIELD | Admitting: Family Medicine

## 2016-10-24 VITALS — BP 102/66 | HR 66 | Temp 99.2°F | Ht 62.0 in | Wt 143.8 lb

## 2016-10-24 DIAGNOSIS — F988 Other specified behavioral and emotional disorders with onset usually occurring in childhood and adolescence: Secondary | ICD-10-CM

## 2016-10-24 MED ORDER — AMPHETAMINE-DEXTROAMPHETAMINE 10 MG PO TABS: ORAL_TABLET | ORAL | 0 refills | Status: DC

## 2016-10-24 MED ORDER — AMPHETAMINE-DEXTROAMPHET ER 20 MG PO CP24: 20.0000 mg | ORAL_CAPSULE | Freq: Every day | ORAL | 0 refills | Status: DC

## 2016-10-24 NOTE — Patient Instructions (Signed)
Refilled ADD medicines  Would advise an annual physical with your gynecologist

## 2016-10-24 NOTE — Progress Notes (Signed)
Pre visit review using our clinic review tool, if applicable. No additional management support is needed unless otherwise documented below in the visit note. 

## 2016-10-24 NOTE — Progress Notes (Signed)
Subjective:  Laura King is a 26 y.o. year old very pleasant female patient who presents for/with See problem oriented charting ROS- inattention issues if missed medication. No chest pain or shortness of breath. No headache or blurry vision.    Past Medical History-  Patient Active Problem List   Diagnosis Date Noted  . Attention deficit disorder (ADD) in adult     Priority: Medium  . Irregular periods/menstrual cycles 11/16/2014    Priority: Low  . Family history of breast cancer 11/16/2014    Priority: Low    Medications- reviewed and updated Current Outpatient Prescriptions  Medication Sig Dispense Refill  . amphetamine-dextroamphetamine (ADDERALL XR) 20 MG 24 hr capsule Take 1 capsule (20 mg total) by mouth daily. Fill now. 30 capsule 0  . amphetamine-dextroamphetamine (ADDERALL XR) 20 MG 24 hr capsule Take 1 capsule (20 mg total) by mouth daily. Fill in 1 month. 30 capsule 0  . amphetamine-dextroamphetamine (ADDERALL XR) 20 MG 24 hr capsule Take 1 capsule (20 mg total) by mouth daily. Fill in 2 months 30 capsule 0  . amphetamine-dextroamphetamine (ADDERALL) 10 MG tablet Take once daily as needed (8 hours after extended release). 4x a week maximum. Fill now. 16 tablet 0  . amphetamine-dextroamphetamine (ADDERALL) 10 MG tablet Take once daily as needed (8 hours after extended release). 4x a week maximum. Fill in 1 month. 16 tablet 0  . amphetamine-dextroamphetamine (ADDERALL) 10 MG tablet Take once daily as needed (8 hours after extended release). 4x a week maximum. Fill in 2 months. 16 tablet 0  . etonogestrel (IMPLANON) 68 MG IMPL implant 1 each by Subdermal route once.    . NON FORMULARY 5 HTP (serotonin supplement per patient)     No current facility-administered medications for this visit.     Objective: BP 102/66 (BP Location: Left Arm, Patient Position: Sitting, Cuff Size: Normal)   Pulse 66   Temp 99.2 F (37.3 C) (Oral)   Ht 5\' 2"  (1.575 m)   Wt 143 lb 12.8 oz (65.2  kg)   LMP 10/16/2016   SpO2 98%   BMI 26.30 kg/m  Gen: NAD, resting comfortably CV: RRR no murmurs rubs or gallops Lungs: CTAB no crackles, wheeze, rhonchi Abdomen: overweight Ext: no edema  Assessment/Plan:  We also updated family history as sister was diagnosed with endometriosis. Spent some time counseling patient- she is worried for her sister who had a miscarriage last year as a result of this issue. In addition she has another friend that has had 5 surgeries due to endometriosis and she wonders about how hard this may be on her sister. I did advise patient to get an annual CPE with GYN as well  Attention deficit disorder (ADD) in adult S: Patient has done well on extended release 20mg  adderrall most days. 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 This combo has worked very well for her.  A/P: will continue current medications  Call in for meds in 3 months, see [place] in 6 months  Meds ordered this encounter  Medications  . amphetamine-dextroamphetamine (ADDERALL XR) 20 MG 24 hr capsule    Sig: Take 1 capsule (20 mg total) by mouth daily. Fill now.    Dispense:  30 capsule    Refill:  0  . amphetamine-dextroamphetamine (ADDERALL XR) 20 MG 24 hr capsule    Sig: Take 1 capsule (20 mg total) by mouth daily. Fill in 1 month.  Dispense:  30 capsule    Refill:  0  . amphetamine-dextroamphetamine (ADDERALL XR) 20 MG 24 hr capsule    Sig: Take 1 capsule (20 mg total) by mouth daily. Fill in 2 months    Dispense:  30 capsule    Refill:  0  . amphetamine-dextroamphetamine (ADDERALL) 10 MG tablet    Sig: Take once daily as needed (8 hours after extended release). 4x a week maximum. Fill now.    Dispense:  16 tablet    Refill:  0  . amphetamine-dextroamphetamine (ADDERALL) 10 MG tablet    Sig: Take once daily as needed (8 hours after extended release). 4x a week maximum. Fill in 1 month.    Dispense:  16 tablet    Refill:  0   . amphetamine-dextroamphetamine (ADDERALL) 10 MG tablet    Sig: Take once daily as needed (8 hours after extended release). 4x a week maximum. Fill in 2 months.    Dispense:  16 tablet    Refill:  0   The duration of face-to-face time during this visit was greater than 15 minutes. Greater than 50% of this time was spent in counseling, explanation of diagnosis, planning of further management, and/or coordination of care including primarily discussion of her worries for her sister and friend related to endometriosis.  Return precautions advised.  Tana ConchStephen Jochebed Bills, MD

## 2016-10-24 NOTE — Assessment & Plan Note (Signed)
S: Patient has done well on extended release 20mg  adderrall most days. 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 This combo has worked very well for her.  A/P: will continue current medications

## 2017-01-23 ENCOUNTER — Telehealth: Payer: Self-pay | Admitting: Family Medicine

## 2017-01-23 NOTE — Telephone Encounter (Signed)
Pt need new Rx for both Adderalls XR 20 MG or 10 MG   Pt is aware of 3 business days for refills and someone will call when ready for pick up.

## 2017-01-25 ENCOUNTER — Other Ambulatory Visit: Payer: Self-pay

## 2017-01-25 MED ORDER — AMPHETAMINE-DEXTROAMPHETAMINE 10 MG PO TABS: ORAL_TABLET | ORAL | 0 refills | Status: DC

## 2017-01-25 MED ORDER — AMPHETAMINE-DEXTROAMPHET ER 20 MG PO CP24: 20.0000 mg | ORAL_CAPSULE | Freq: Every day | ORAL | 0 refills | Status: DC

## 2017-01-26 NOTE — Telephone Encounter (Signed)
Rx's are ready for pick up and are in the front drawer.  Pt is aware they are ready for pick up.

## 2017-02-27 ENCOUNTER — Ambulatory Visit: Payer: BLUE CROSS/BLUE SHIELD | Admitting: Family Medicine
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
## 2017-03-16 ENCOUNTER — Encounter (HOSPITAL_BASED_OUTPATIENT_CLINIC_OR_DEPARTMENT_OTHER): Payer: Self-pay | Admitting: *Deleted

## 2017-03-19 ENCOUNTER — Encounter (HOSPITAL_BASED_OUTPATIENT_CLINIC_OR_DEPARTMENT_OTHER): Payer: Self-pay | Admitting: *Deleted

## 2017-03-19 NOTE — Progress Notes (Signed)
NPO AFTER MN.  ARRIVE AT 0600.  NEEDS URINE PREG.  GETTING CBC AND T&S PRIOR TO DOS.

## 2017-03-22 DIAGNOSIS — N946 Dysmenorrhea, unspecified: Secondary | ICD-10-CM | POA: Diagnosis present

## 2017-03-22 DIAGNOSIS — Z79899 Other long term (current) drug therapy: Secondary | ICD-10-CM | POA: Diagnosis not present

## 2017-03-22 DIAGNOSIS — F988 Other specified behavioral and emotional disorders with onset usually occurring in childhood and adolescence: Secondary | ICD-10-CM | POA: Diagnosis not present

## 2017-03-22 LAB — CBC
HEMATOCRIT: 39.2 % (ref 36.0–46.0)
HEMOGLOBIN: 13.8 g/dL (ref 12.0–15.0)
MCH: 29.6 pg (ref 26.0–34.0)
MCHC: 35.2 g/dL (ref 30.0–36.0)
MCV: 84.1 fL (ref 78.0–100.0)
Platelets: 255 10*3/uL (ref 150–400)
RBC: 4.66 MIL/uL (ref 3.87–5.11)
RDW: 12.2 % (ref 11.5–15.5)
WBC: 6.1 10*3/uL (ref 4.0–10.5)

## 2017-03-22 LAB — TYPE AND SCREEN
ABO/RH(D): O POS
Antibody Screen: NEGATIVE

## 2017-03-22 NOTE — Progress Notes (Signed)
Pt informed ok to remove blood bank bracelet. T&S will be redrawn  DOS. Confirmed arrival at 0600.

## 2017-03-28 NOTE — Progress Notes (Signed)
Left message for pt to arrive in am @ 0545 to get T&S.

## 2017-03-29 ENCOUNTER — Ambulatory Visit (HOSPITAL_BASED_OUTPATIENT_CLINIC_OR_DEPARTMENT_OTHER): Payer: BLUE CROSS/BLUE SHIELD | Admitting: Anesthesiology

## 2017-03-29 ENCOUNTER — Encounter (HOSPITAL_BASED_OUTPATIENT_CLINIC_OR_DEPARTMENT_OTHER)
Admission: RE | Disposition: A | Discharge status: Home / Self Care | Payer: Self-pay | Source: Ambulatory Visit | Attending: Obstetrics and Gynecology

## 2017-03-29 ENCOUNTER — Encounter (HOSPITAL_BASED_OUTPATIENT_CLINIC_OR_DEPARTMENT_OTHER): Payer: Self-pay | Admitting: *Deleted

## 2017-03-29 ENCOUNTER — Ambulatory Visit (HOSPITAL_BASED_OUTPATIENT_CLINIC_OR_DEPARTMENT_OTHER)
Admission: RE | Admit: 2017-03-29 | Discharge: 2017-03-29 | Disposition: A | Discharge status: Home / Self Care | Payer: BLUE CROSS/BLUE SHIELD | Source: Ambulatory Visit | Attending: Obstetrics and Gynecology | Admitting: Obstetrics and Gynecology

## 2017-03-29 DIAGNOSIS — Z79899 Other long term (current) drug therapy: Secondary | ICD-10-CM | POA: Insufficient documentation

## 2017-03-29 DIAGNOSIS — N946 Dysmenorrhea, unspecified: Secondary | ICD-10-CM | POA: Insufficient documentation

## 2017-03-29 DIAGNOSIS — Z9889 Other specified postprocedural states: Secondary | ICD-10-CM

## 2017-03-29 DIAGNOSIS — F988 Other specified behavioral and emotional disorders with onset usually occurring in childhood and adolescence: Secondary | ICD-10-CM | POA: Insufficient documentation

## 2017-03-29 HISTORY — DX: Personal history of traumatic brain injury: Z87.820

## 2017-03-29 HISTORY — PX: LAPAROSCOPY: SHX197

## 2017-03-29 HISTORY — DX: Dysmenorrhea, unspecified: N94.6

## 2017-03-29 HISTORY — DX: Unspecified osteoarthritis, unspecified site: M19.90

## 2017-03-29 HISTORY — DX: Attention-deficit hyperactivity disorder, unspecified type: F90.9

## 2017-03-29 HISTORY — DX: Excessive and frequent menstruation with regular cycle: N92.0

## 2017-03-29 HISTORY — DX: Other specified postprocedural states: Z98.890

## 2017-03-29 LAB — TYPE AND SCREEN
ABO/RH(D): O POS
Antibody Screen: NEGATIVE

## 2017-03-29 LAB — POCT PREGNANCY, URINE: Preg Test, Ur: NEGATIVE

## 2017-03-29 SURGERY — LAPAROSCOPY, DIAGNOSTIC
Anesthesia: General | Site: Abdomen

## 2017-03-29 MED ORDER — KETOROLAC TROMETHAMINE 30 MG/ML IJ SOLN
30.0000 mg | Freq: Once | INTRAMUSCULAR | Status: DC
  Filled 2017-03-29: qty 1

## 2017-03-29 MED ORDER — MENTHOL 3 MG MT LOZG
1.0000 | LOZENGE | OROMUCOSAL | Status: DC | PRN
  Filled 2017-03-29: qty 9

## 2017-03-29 MED ORDER — ROCURONIUM BROMIDE 50 MG/5ML IV SOSY
PREFILLED_SYRINGE | INTRAVENOUS | Status: DC | PRN
  Administered 2017-03-29: 50 mg via INTRAVENOUS
  Administered 2017-03-29 (×2): 10 mg via INTRAVENOUS

## 2017-03-29 MED ORDER — OXYCODONE-ACETAMINOPHEN 5-325 MG PO TABS
1.0000 | ORAL_TABLET | ORAL | Status: DC | PRN
  Filled 2017-03-29: qty 2

## 2017-03-29 MED ORDER — BUPIVACAINE HCL (PF) 0.25 % IJ SOLN
INTRAMUSCULAR | Status: DC | PRN
Start: 2017-03-29 — End: 2017-03-29
  Administered 2017-03-29: 15 mL

## 2017-03-29 MED ORDER — MIDAZOLAM HCL 5 MG/5ML IJ SOLN
INTRAMUSCULAR | Status: DC | PRN
  Administered 2017-03-29: 2 mg via INTRAVENOUS

## 2017-03-29 MED ORDER — LACTATED RINGERS IV SOLN
INTRAVENOUS | Status: DC
  Filled 2017-03-29: qty 1000

## 2017-03-29 MED ORDER — ROCURONIUM BROMIDE 50 MG/5ML IV SOSY
PREFILLED_SYRINGE | INTRAVENOUS | Status: AC
  Filled 2017-03-29: qty 5

## 2017-03-29 MED ORDER — FENTANYL CITRATE (PF) 100 MCG/2ML IJ SOLN
INTRAMUSCULAR | Status: AC
  Filled 2017-03-29: qty 2

## 2017-03-29 MED ORDER — SOD CITRATE-CITRIC ACID 500-334 MG/5ML PO SOLN
30.0000 mL | ORAL | Status: DC
  Filled 2017-03-29: qty 30

## 2017-03-29 MED ORDER — ONDANSETRON HCL 4 MG/2ML IJ SOLN
INTRAMUSCULAR | Status: DC | PRN
  Administered 2017-03-29: 4 mg via INTRAVENOUS

## 2017-03-29 MED ORDER — LIDOCAINE 2% (20 MG/ML) 5 ML SYRINGE
INTRAMUSCULAR | Status: AC
  Filled 2017-03-29: qty 5

## 2017-03-29 MED ORDER — KETOROLAC TROMETHAMINE 30 MG/ML IJ SOLN
INTRAMUSCULAR | Status: DC | PRN
  Administered 2017-03-29: 30 mg via INTRAVENOUS

## 2017-03-29 MED ORDER — IBUPROFEN 600 MG PO TABS
600.0000 mg | ORAL_TABLET | Freq: Four times a day (QID) | ORAL | Status: DC | PRN
  Filled 2017-03-29: qty 1

## 2017-03-29 MED ORDER — FENTANYL CITRATE (PF) 100 MCG/2ML IJ SOLN
INTRAMUSCULAR | Status: DC | PRN
  Administered 2017-03-29: 25 ug via INTRAVENOUS
  Administered 2017-03-29 (×2): 50 ug via INTRAVENOUS
  Administered 2017-03-29: 25 ug via INTRAVENOUS
  Administered 2017-03-29: 50 ug via INTRAVENOUS

## 2017-03-29 MED ORDER — MIDAZOLAM HCL 2 MG/2ML IJ SOLN
INTRAMUSCULAR | Status: AC
  Filled 2017-03-29: qty 2

## 2017-03-29 MED ORDER — PROPOFOL 10 MG/ML IV BOLUS
INTRAVENOUS | Status: AC
  Filled 2017-03-29: qty 20

## 2017-03-29 MED ORDER — OXYCODONE-ACETAMINOPHEN 5-325 MG PO TABS: 1.0000 | ORAL_TABLET | ORAL | 0 refills | Status: DC | PRN

## 2017-03-29 MED ORDER — LACTATED RINGERS IV SOLN
INTRAVENOUS | Status: DC
  Administered 2017-03-29: 06:00:00 via INTRAVENOUS
  Filled 2017-03-29: qty 1000

## 2017-03-29 MED ORDER — MEPERIDINE HCL 25 MG/ML IJ SOLN
6.2500 mg | INTRAMUSCULAR | Status: DC | PRN
  Filled 2017-03-29: qty 1

## 2017-03-29 MED ORDER — KETOROLAC TROMETHAMINE 30 MG/ML IJ SOLN
INTRAMUSCULAR | Status: AC
  Filled 2017-03-29: qty 1

## 2017-03-29 MED ORDER — ONDANSETRON HCL 4 MG/2ML IJ SOLN
4.0000 mg | Freq: Four times a day (QID) | INTRAMUSCULAR | Status: DC | PRN
  Filled 2017-03-29: qty 2

## 2017-03-29 MED ORDER — METOCLOPRAMIDE HCL 5 MG/ML IJ SOLN
10.0000 mg | Freq: Once | INTRAMUSCULAR | Status: DC | PRN
  Filled 2017-03-29: qty 2

## 2017-03-29 MED ORDER — FENTANYL CITRATE (PF) 100 MCG/2ML IJ SOLN
25.0000 ug | INTRAMUSCULAR | Status: DC | PRN
  Administered 2017-03-29: 25 ug via INTRAVENOUS
  Filled 2017-03-29: qty 1

## 2017-03-29 MED ORDER — PROPOFOL 10 MG/ML IV BOLUS
INTRAVENOUS | Status: DC | PRN
Start: 2017-03-29 — End: 2017-03-29
  Administered 2017-03-29: 160 mg via INTRAVENOUS

## 2017-03-29 MED ORDER — SIMETHICONE 80 MG PO CHEW
80.0000 mg | CHEWABLE_TABLET | Freq: Four times a day (QID) | ORAL | Status: DC | PRN
  Filled 2017-03-29: qty 1

## 2017-03-29 MED ORDER — LIDOCAINE 2% (20 MG/ML) 5 ML SYRINGE
INTRAMUSCULAR | Status: DC | PRN
Start: 2017-03-29 — End: 2017-03-29
  Administered 2017-03-29: 60 mg via INTRAVENOUS

## 2017-03-29 MED ORDER — SUGAMMADEX SODIUM 200 MG/2ML IV SOLN
INTRAVENOUS | Status: DC | PRN
  Administered 2017-03-29: 200 mg via INTRAVENOUS

## 2017-03-29 MED ORDER — ONDANSETRON HCL 4 MG PO TABS
4.0000 mg | ORAL_TABLET | Freq: Four times a day (QID) | ORAL | Status: DC | PRN
  Filled 2017-03-29: qty 1

## 2017-03-29 MED ORDER — DEXAMETHASONE SODIUM PHOSPHATE 4 MG/ML IJ SOLN
INTRAMUSCULAR | Status: DC | PRN
  Administered 2017-03-29: 10 mg via INTRAVENOUS

## 2017-03-29 MED ORDER — GLYCOPYRROLATE 0.2 MG/ML IV SOSY
PREFILLED_SYRINGE | INTRAVENOUS | Status: DC | PRN
  Administered 2017-03-29: .2 mg via INTRAVENOUS

## 2017-03-29 SURGICAL SUPPLY — 38 items
CABLE HIGH FREQUENCY MONO STRZ (ELECTRODE) IMPLANT
CATH ROBINSON RED A/P 16FR (CATHETERS) ×3 IMPLANT
CLOTH BEACON ORANGE TIMEOUT ST (SAFETY) ×3 IMPLANT
COVER MAYO STAND STRL (DRAPES) ×3 IMPLANT
DECANTER SPIKE VIAL GLASS SM (MISCELLANEOUS) IMPLANT
DERMABOND ADVANCED (GAUZE/BANDAGES/DRESSINGS) ×1
DERMABOND ADVANCED .7 DNX12 (GAUZE/BANDAGES/DRESSINGS) ×2 IMPLANT
DRSG OPSITE POSTOP 3X4 (GAUZE/BANDAGES/DRESSINGS) IMPLANT
DRSG TELFA 3X8 NADH (GAUZE/BANDAGES/DRESSINGS) ×3 IMPLANT
DURAPREP 26ML APPLICATOR (WOUND CARE) ×3 IMPLANT
FILTER SMOKE EVAC LAPAROSHD (FILTER) ×3 IMPLANT
GLOVE BIO SURGEON STRL SZ 6.5 (GLOVE) ×3 IMPLANT
GLOVE BIOGEL PI IND STRL 6.5 (GLOVE) ×2 IMPLANT
GLOVE BIOGEL PI IND STRL 7.0 (GLOVE) ×2 IMPLANT
GLOVE BIOGEL PI INDICATOR 6.5 (GLOVE) ×1
GLOVE BIOGEL PI INDICATOR 7.0 (GLOVE) ×1
GOWN STRL REUS W/TWL LRG LVL3 (GOWN DISPOSABLE) ×9 IMPLANT
NEEDLE INSUFFLATION 120MM (ENDOMECHANICALS) ×3 IMPLANT
NS IRRIG 1000ML POUR BTL (IV SOLUTION) ×3 IMPLANT
PACK LAPAROSCOPY BASIN (CUSTOM PROCEDURE TRAY) ×3 IMPLANT
PACK TRENDGUARD 450 HYBRID PRO (MISCELLANEOUS) ×2 IMPLANT
PACK TRENDGUARD 600 HYBRD PROC (MISCELLANEOUS) IMPLANT
POUCH SPECIMEN RETRIEVAL 10MM (ENDOMECHANICALS) IMPLANT
PROTECTOR NERVE ULNAR (MISCELLANEOUS) ×6 IMPLANT
SET IRRIG TUBING LAPAROSCOPIC (IRRIGATION / IRRIGATOR) IMPLANT
SHEARS HARMONIC ACE PLUS 36CM (ENDOMECHANICALS) ×3 IMPLANT
SLEEVE XCEL OPT CAN 5 100 (ENDOMECHANICALS) IMPLANT
SOLUTION ELECTROLUBE (MISCELLANEOUS) IMPLANT
SUT VIC AB 3-0 PS2 18 (SUTURE) ×1
SUT VIC AB 3-0 PS2 18XBRD (SUTURE) ×2 IMPLANT
SUT VICRYL 0 UR6 27IN ABS (SUTURE) ×3 IMPLANT
TOWEL OR 17X24 6PK STRL BLUE (TOWEL DISPOSABLE) ×6 IMPLANT
TRENDGUARD 450 HYBRID PRO PACK (MISCELLANEOUS) ×3
TRENDGUARD 600 HYBRID PROC PK (MISCELLANEOUS)
TROCAR BLADELESS OPT 5 100 (ENDOMECHANICALS) ×9 IMPLANT
TROCAR XCEL NON-BLD 11X100MML (ENDOMECHANICALS) IMPLANT
TUBING INSUF HEATED (TUBING) ×3 IMPLANT
WARMER LAPAROSCOPE (MISCELLANEOUS) IMPLANT

## 2017-03-29 NOTE — Transfer of Care (Signed)
Immediate Anesthesia Transfer of Care Note  Patient: Laura King  Procedure(s) Performed: Procedure(s) (LRB): LAPAROSCOPY DIAGNOSTIC, peritoneal biopsy (N/A)  Patient Location: PACU  Anesthesia Type: General  Level of Consciousness: awake, oriented, sedated and patient cooperative  Airway & Oxygen Therapy: Patient Spontanous Breathing and Patient connected to face mask oxygen  Post-op Assessment: Report given to PACU RN and Post -op Vital signs reviewed and stable  Post vital signs: Reviewed and stable  Complications: No apparent anesthesia complications  Last Vitals:  Vitals:   03/29/17 0600 03/29/17 0910  BP: 102/63 (!) 101/48  Pulse: (!) 54 (!) 52  Resp: 16 12  Temp: 36.9 C (!) 36.4 C  SpO2: 100% 100%    Last Pain:  Vitals:   03/29/17 0600  TempSrc: Oral

## 2017-03-29 NOTE — Anesthesia Preprocedure Evaluation (Signed)
Anesthesia Evaluation  Patient identified by MRN, date of birth, ID band Patient awake    Reviewed: Allergy & Precautions, NPO status , Patient's Chart, lab work & pertinent test results  Airway Mallampati: II  TM Distance: >3 FB Neck ROM: Full    Dental no notable dental hx.    Pulmonary neg pulmonary ROS,    Pulmonary exam normal breath sounds clear to auscultation       Cardiovascular negative cardio ROS Normal cardiovascular exam Rhythm:Regular Rate:Normal     Neuro/Psych negative neurological ROS  negative psych ROS   GI/Hepatic negative GI ROS, Neg liver ROS,   Endo/Other  negative endocrine ROS  Renal/GU negative Renal ROS  negative genitourinary   Musculoskeletal negative musculoskeletal ROS (+)   Abdominal   Peds negative pediatric ROS (+)  Hematology negative hematology ROS (+)   Anesthesia Other Findings   Reproductive/Obstetrics negative OB ROS                             Anesthesia Physical Anesthesia Plan  ASA: I  Anesthesia Plan: General   Post-op Pain Management:    Induction: Intravenous  PONV Risk Score and Plan: 4 or greater and Ondansetron, Dexamethasone, Midazolam, Scopolamine patch - Pre-op and Treatment may vary due to age or medical condition  Airway Management Planned: Oral ETT  Additional Equipment:   Intra-op Plan:   Post-operative Plan:   Informed Consent: I have reviewed the patients History and Physical, chart, labs and discussed the procedure including the risks, benefits and alternatives for the proposed anesthesia with the patient or authorized representative who has indicated his/her understanding and acceptance.   Dental advisory given  Plan Discussed with: CRNA  Anesthesia Plan Comments:         Anesthesia Quick Evaluation

## 2017-03-29 NOTE — Interval H&P Note (Signed)
History and Physical Interval Note:Late entry signed  03/29/2017 9:07 AM  Jenkins RougeKristin Hipp  has presented today for surgery, with the diagnosis of chronic pelvic pain  The various methods of treatment have been discussed with the patient and family. After consideration of risks, benefits and other options for treatment, the patient has consented to  Procedure(s): LAPAROSCOPY DIAGNOSTIC, peritoneal biopsy (N/A) ; possible ablation/excision of endometriosis as a surgical intervention .  The patient's history has been reviewed, patient examined, no change in status, stable for surgery.  I have reviewed the patient's chart and labs.  Questions were answered to the patient's satisfaction.     Cathrine Musterecilia W Banga

## 2017-03-29 NOTE — Op Note (Signed)
Operative Note    Preoperative Diagnosis: Dysmenorrhea   Postoperative Diagnosis: same   Procedure: Diagnostic laparoscopy with peritoneal biopsy/excision of suspected endometriosis   Surgeon: Britt BottomBanga C, DO  Anesthesia: general  Fluids: LR at 1500ml EBL: <5250ml UOP <1300ml  Findings: Grossly normal uterus, tubes and ovaries; questionable endometriosis on left ant peritoneum and in posterior cul de sac. Bilateral ureters noted in normal anatomic position. Appendix nl   Specimen: peritoneal biopsies   Procedure Note  Patient was taken to the operating room where general anesthesia was obtained without difficulty. She was then prepped and draped in the normal sterile fashion in the dorsal lithotomy position and arms by her sides.  An appropriate timeout was performed. A speculum was then placed within the vagina and a Hulka tenaculum placed within the cervix for uterine manipulation after the bladder was emptied. Attention was then turned to the patient's abdomen after draping where the infraumbilical area was injected with approximately 5ml of quarter percent Marcaine. A 5 mm incision was then made within the umbilicus and the optiview trocar and camera easily introduced into the abdominal cavity.Gas flow was then applied and a pneumoperitoneum obtained with approximate 3 L of CO2 gas.   A second and third 5mm trocars were placed in the patients left and right  lower uterine segments under direct visualization and a better survey done.   With patient in Trendelenburg the uterus and tubes and ovaries were inspected with findings as stated above. Biopsies/Excision of questionable endometriosis was performed in above noted sites and scant bleeding cauterized with excellent hemostasis.  The remainder of the pelvis and abdomen were inspected and found to be normal. Ureters in normal position.Irrigation was performed.  No further bleeding even on reduction of pneumoperitoneum. The instruments were  removed from the abdomen under visualization and the pneumoperitoneum reduced through the trocar site. Dermabond was applied to the incisions after closure with suture. Hulka was also removed - cervix hemostatic. Patient was then awakened and taken to the recovery room in good condition. Counts were all confirmed.

## 2017-03-29 NOTE — Discharge Instructions (Signed)
Nothing in vagina for 6 weeks.  No sex, tampons, and douching.  Other instructions as in [facility] Discharge Booklet. ° ° °Post Anesthesia Home Care Instructions ° °Activity: °Get plenty of rest for the remainder of the day. A responsible individual must stay with you for 24 hours following the procedure.  °For the next 24 hours, DO NOT: °-Drive a car °-Operate machinery °-Drink alcoholic beverages °-Take any medication unless instructed by your physician °-Make any legal decisions or sign important papers. ° °Meals: °Start with liquid foods such as gelatin or soup. Progress to regular foods as tolerated. Avoid greasy, spicy, heavy foods. If nausea and/or vomiting occur, drink only clear liquids until the nausea and/or vomiting subsides. Call your physician if vomiting continues. ° °Special Instructions/Symptoms: °Your throat may feel dry or sore from the anesthesia or the breathing tube placed in your throat during surgery. If this causes discomfort, gargle with warm salt water. The discomfort should disappear within 24 hours. ° °If you had a scopolamine patch placed behind your ear for the management of post- operative nausea and/or vomiting: ° °1. The medication in the patch is effective for 72 hours, after which it should be removed.  Wrap patch in a tissue and discard in the trash. Wash hands thoroughly with soap and water. °2. You may remove the patch earlier than 72 hours if you experience unpleasant side effects which may include dry mouth, dizziness or visual disturbances. °3. Avoid touching the patch. Wash your hands with soap and water after contact with the patch. °  °

## 2017-03-29 NOTE — Anesthesia Postprocedure Evaluation (Signed)
Anesthesia Post Note  Patient: Jenkins RougeKristin Angelo  Procedure(s) Performed: Procedure(s) (LRB): LAPAROSCOPY DIAGNOSTIC, peritoneal biopsy (N/A)     Patient location during evaluation: PACU Anesthesia Type: General Level of consciousness: awake and alert Pain management: pain level controlled Vital Signs Assessment: post-procedure vital signs reviewed and stable Respiratory status: spontaneous breathing, nonlabored ventilation, respiratory function stable and patient connected to nasal cannula oxygen Cardiovascular status: blood pressure returned to baseline and stable Postop Assessment: no signs of nausea or vomiting Anesthetic complications: no    Last Vitals:  Vitals:   03/29/17 0945 03/29/17 1000  BP: 99/63 (!) 96/57  Pulse: (!) 48 (!) 50  Resp: 12 10  Temp:    SpO2: 100% 97%    Last Pain:  Vitals:   03/29/17 1027  TempSrc:   PainSc: 0-No pain                 Phillips Groutarignan, Jeannemarie Sawaya

## 2017-03-29 NOTE — Anesthesia Procedure Notes (Signed)
Procedure Name: Intubation Date/Time: 03/29/2017 7:43 AM Performed by: Denna Haggard D Pre-anesthesia Checklist: Patient identified, Emergency Drugs available, Suction available and Patient being monitored Patient Re-evaluated:Patient Re-evaluated prior to induction Oxygen Delivery Method: Circle system utilized Preoxygenation: Pre-oxygenation with 100% oxygen Induction Type: IV induction Ventilation: Mask ventilation without difficulty Laryngoscope Size: Mac and 3 Grade View: Grade I Tube type: Oral Tube size: 7.0 mm Number of attempts: 1 Airway Equipment and Method: Stylet and Oral airway Placement Confirmation: ETT inserted through vocal cords under direct vision,  positive ETCO2 and breath sounds checked- equal and bilateral Secured at: 21 cm Tube secured with: Tape Dental Injury: Teeth and Oropharynx as per pre-operative assessment

## 2017-03-30 ENCOUNTER — Encounter (HOSPITAL_BASED_OUTPATIENT_CLINIC_OR_DEPARTMENT_OTHER): Payer: Self-pay | Admitting: Obstetrics and Gynecology

## 2017-05-03 ENCOUNTER — Ambulatory Visit (INDEPENDENT_AMBULATORY_CARE_PROVIDER_SITE_OTHER): Payer: BLUE CROSS/BLUE SHIELD | Admitting: Family Medicine

## 2017-05-03 ENCOUNTER — Encounter: Payer: Self-pay | Admitting: Family Medicine

## 2017-05-03 VITALS — BP 94/52 | HR 55 | Temp 98.6°F | Ht 62.0 in | Wt 147.2 lb

## 2017-05-03 DIAGNOSIS — F988 Other specified behavioral and emotional disorders with onset usually occurring in childhood and adolescence: Secondary | ICD-10-CM

## 2017-05-03 DIAGNOSIS — Z79899 Other long term (current) drug therapy: Secondary | ICD-10-CM

## 2017-05-03 MED ORDER — AMPHETAMINE-DEXTROAMPHET ER 20 MG PO CP24: 20.0000 mg | ORAL_CAPSULE | Freq: Every day | ORAL | 0 refills | Status: DC

## 2017-05-03 MED ORDER — AMPHETAMINE-DEXTROAMPHETAMINE 10 MG PO TABS: ORAL_TABLET | ORAL | 0 refills | Status: DC

## 2017-05-03 NOTE — Progress Notes (Signed)
Subjective:  Laura King is a 26 y.o. year old very pleasant female patient who presents for/with See problem oriented charting ROS- No chest pain or shortness of breath. No headache or blurry vision.  No palpitations or unintentional weight loss.  No lightheadedness despite noted blood pressure  Past Medical History-  Patient Active Problem List   Diagnosis Date Noted  . Attention deficit disorder (ADD) in adult     Priority: Medium  . Irregular periods/menstrual cycles 11/16/2014    Priority: Low  . Family history of breast cancer 11/16/2014    Priority: Low  . Status post laparoscopy 03/29/2017    Medications- reviewed and updated Current Outpatient Prescriptions  Medication Sig Dispense Refill  . etonogestrel (IMPLANON) 68 MG IMPL implant 1 each by Subdermal route once.    Marland Kitchen Ketorolac Tromethamine (SPRIX) 15.75 MG/SPRAY SOLN Place 1 spray into the nose as needed.    Marland Kitchen amphetamine-dextroamphetamine (ADDERALL XR) 20 MG 24 hr capsule Take 1 capsule (20 mg total) by mouth daily. Fill now. 30 capsule 0  . amphetamine-dextroamphetamine (ADDERALL XR) 20 MG 24 hr capsule Take 1 capsule (20 mg total) by mouth daily. Fill in 1 month. 30 capsule 0  . amphetamine-dextroamphetamine (ADDERALL XR) 20 MG 24 hr capsule Take 1 capsule (20 mg total) by mouth daily. Fill in 2 months 30 capsule 0  . amphetamine-dextroamphetamine (ADDERALL) 10 MG tablet Take once daily as needed (8 hours after extended release). 4x a week maximum. Fill now. 16 tablet 0  . amphetamine-dextroamphetamine (ADDERALL) 10 MG tablet Take once daily as needed (8 hours after extended release). 4x a week maximum. Fill in 1 month. 16 tablet 0  . amphetamine-dextroamphetamine (ADDERALL) 10 MG tablet Take once daily as needed (8 hours after extended release). 4x a week maximum. Fill in 2 months. 16 tablet 0   No current facility-administered medications for this visit.     Objective: BP (!) 94/52 (BP Location: Left Arm, Patient  Position: Sitting, Cuff Size: Large)   Pulse (!) 55   Temp 98.6 F (37 C) (Oral)   Ht  (1.575 m)   Wt 147 lb 3.2 oz (66.8 kg)   SpO2 98%   BMI 26.92 kg/m  Gen: NAD, resting comfortably CV: RRR no murmurs rubs or gallops. Not bradycardic on my exam.  Lungs: CTAB no crackles, wheeze, rhonchi Ext: no edema Skin: warm, dry, no rash  Assessment/Plan:  Attention deficit disorder (ADD) in adult S: continues to do well on extended release adderrall  for most part. 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 Has been a good combo for her. No side effects.   03/28/16 controlled substance contract. Original records 05/12/08 [employer].  A/P: She asks me about option of provigil. Reviewed uptodate with her. Poor evidence in adults. 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 Was just hoping to get off stimulant- declines options like strattera.   UDS ordered today- walked her to lab and showed her where water was to drink if she could not urinate. Do not see this was collected. May need to decline 3 month refill unless get UDS and certainly 6 month. Give scripts after urine is collected.   Orders Placed This Encounter  Procedures  . Drug Abuse Panel 10-50 No Conf, U    Meds ordered this encounter  Medications  . Ketorolac Tromethamine (SPRIX) 15.75 MG/SPRAY SOLN    Sig:  Place 1 spray into the nose as needed.  Marland Kitchen. amphetamine-dextroamphetamine (ADDERALL XR) 20 MG 24 hr capsule    Sig: Take 1 capsule (20 mg total) by mouth daily. Fill now.    Dispense:  30 capsule    Refill:  0  . amphetamine-dextroamphetamine (ADDERALL XR) 20 MG 24 hr capsule    Sig: Take 1 capsule (20 mg total) by mouth daily. Fill in 1 month.    Dispense:  30 capsule    Refill:  0  . amphetamine-dextroamphetamine (ADDERALL XR) 20 MG 24 hr capsule    Sig: Take 1 capsule (20 mg total) by mouth daily. Fill in 2 months     Dispense:  30 capsule    Refill:  0  . amphetamine-dextroamphetamine (ADDERALL) 10 MG tablet    Sig: Take once daily as needed (8 hours after extended release). 4x a week maximum. Fill now.    Dispense:  16 tablet    Refill:  0  . amphetamine-dextroamphetamine (ADDERALL) 10 MG tablet    Sig: Take once daily as needed (8 hours after extended release). 4x a week maximum. Fill in 1 month.    Dispense:  16 tablet    Refill:  0  . amphetamine-dextroamphetamine (ADDERALL) 10 MG tablet    Sig: Take once daily as needed (8 hours after extended release). 4x a week maximum. Fill in 2 months.    Dispense:  16 tablet    Refill:  0   The duration of face-to-face time during this visit was greater than 15 minutes. Greater than 50% of this time was spent in counseling, explanation of diagnosis, planning of further management, and/or coordination of care including discussing add treatment options particularly off label use of Provigil.     Return precautions advised.  Tana ConchStephen Annjeanette Sarwar, MD

## 2017-05-03 NOTE — Patient Instructions (Signed)
Refilled meds  May call in 3 months from now. Can get physical in 6 months if you would like (get one here and GYN usually)

## 2017-05-04 NOTE — Assessment & Plan Note (Signed)
S: continues to do well on extended release adderrall  for most part. 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 Has been a good combo for her. No side effects.   03/28/16 controlled substance contract. Original records 05/12/08 [employer].  A/P: She asks me about option of provigil. Reviewed uptodate with her. Poor evidence in adults. 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 Was just hoping to get off stimulant- declines options like strattera.   UDS ordered today- walked her to lab and showed her where water was to drink if she could not urinate. Do not see this was collected. May need to decline 3 month refill unless get UDS and certainly 6 month. Give scripts after urine is collected.

## 2017-05-22 ENCOUNTER — Telehealth: Payer: Self-pay

## 2017-05-22 NOTE — Telephone Encounter (Signed)
Patient requesting a call back. She is not sure why she needs a lab visit scheduled. Call patient to advise.

## 2017-05-22 NOTE — Telephone Encounter (Signed)
Called patient and left a voicemail message asking for a return phone call to get her scheduled for a lab visit

## 2017-05-22 NOTE — Telephone Encounter (Signed)
She will call to schedule an appointment

## 2017-05-22 NOTE — Telephone Encounter (Signed)
-----   Message from Shelva Majestic, MD sent at 05/08/2017  4:36 PM EDT ----- Need UDS. She did not drop this off  Tana Conch  ----- Message ----- From: SYSTEM Sent: 05/08/2017  12:05 AM To: Shelva Majestic, MD

## 2017-05-22 NOTE — Telephone Encounter (Signed)
Spoke with patient who states she did provide a urine specimen at her last visit. She states her schedule is super busy right now as she just started a new class. She is going to look at her schedule and see if she can get time to come by this office and provide a urine specimen.

## 2017-05-28 ENCOUNTER — Other Ambulatory Visit: Payer: BLUE CROSS/BLUE SHIELD

## 2017-05-28 DIAGNOSIS — F988 Other specified behavioral and emotional disorders with onset usually occurring in childhood and adolescence: Secondary | ICD-10-CM

## 2017-05-30 LAB — PAIN MGMT, PROFILE 8 W/CONF, U
6 Acetylmorphine: NEGATIVE ng/mL (ref <10)
ALCOHOL METABOLITES: NEGATIVE ng/mL (ref <500)
AMPHETAMINE: 1264 ng/mL — AB (ref <250)
Amphetamines: POSITIVE ng/mL — AB (ref <500)
BENZODIAZEPINES: NEGATIVE ng/mL (ref <100)
BUPRENORPHINE, URINE: NEGATIVE ng/mL (ref <5)
COCAINE METABOLITE: NEGATIVE ng/mL (ref <150)
Creatinine: 10.2 mg/dL — ABNORMAL LOW
MDMA: NEGATIVE ng/mL (ref <500)
METHAMPHETAMINE: NEGATIVE ng/mL (ref <250)
Marijuana Metabolite: NEGATIVE ng/mL (ref <20)
OXYCODONE: NEGATIVE ng/mL (ref <100)
Opiates: NEGATIVE ng/mL (ref <100)
Oxidant: NEGATIVE ug/mL (ref <200)
Specific Gravity: 1.003 (ref >=1.0)
pH: 6.53 (ref 4.5–9.0)

## 2017-07-06 ENCOUNTER — Telehealth: Payer: Self-pay | Admitting: Family Medicine

## 2017-07-06 NOTE — Telephone Encounter (Signed)
MEDICATION:  amphetamine-dextroamphetamine (ADDERALL XR) 20 MG 24 hr capsule amphetamine-dextroamphetamine (ADDERALL) 10 MG tablet  [facility] [address], [place], [place] [id number] [phone]   IS THIS A 90 DAY SUPPLY : Y  IS PATIENT OUT OF MEDICATION: Y  IF NOT; HOW MUCH IS LEFT:   LAST APPOINTMENT DATE: @10 /09/2016  NEXT APPOINTMENT DATE:@Visit  date not found  OTHER COMMENTS: Patient stated she left her Rx at home and is now out of state and needs Rx sent to pharmacy above. Please advise.    **Let patient know to contact pharmacy at the end of the day to make sure medication is ready. **  ** Please notify patient to allow 48-72 hours to process**  **Encourage patient to contact the pharmacy for refills or they can request refills through [facility]**

## 2017-07-09 NOTE — Telephone Encounter (Signed)
We cannot send this medication into a pharmacy.  Prescription has to be picked up by the patient.  LM for patient to return call.

## 2017-07-17 ENCOUNTER — Other Ambulatory Visit: Payer: Self-pay

## 2017-07-17 MED ORDER — AMPHETAMINE-DEXTROAMPHETAMINE 10 MG PO TABS: ORAL_TABLET | ORAL | 0 refills | Status: DC

## 2017-07-17 MED ORDER — AMPHETAMINE-DEXTROAMPHET ER 20 MG PO CP24: 20.0000 mg | ORAL_CAPSULE | Freq: Every day | ORAL | 0 refills | Status: DC

## 2017-07-17 NOTE — Telephone Encounter (Signed)
Prescriptions printed

## 2017-07-18 NOTE — Telephone Encounter (Signed)
Called patient and left a voicemail message letting patient know that her prescriptions had been printed out and placed at the front desk for her to pick up at her convenience.

## 2017-08-07 ENCOUNTER — Telehealth: Payer: Self-pay | Admitting: Family Medicine

## 2017-08-07 NOTE — Telephone Encounter (Signed)
Copied from CRM [id number]. Topic: General - Other >> Aug 07, 2017  1:34 PM Cecelia ByarsGreen, Joran Kallal L, RMA wrote: Reason for CRM: Medication refill request for adderall 20 mg and 10 mg, please call pt when ready for pick up

## 2017-08-07 NOTE — Telephone Encounter (Signed)
Please advise on medication refill request below.  °

## 2017-08-08 NOTE — Telephone Encounter (Signed)
Called patient to let her know that prescription refills have been sitting at front desk waiting to be picked up for the last month. Left a voicemail message letting her know they could be picked up at the front desk at her convenience

## 2017-11-16 ENCOUNTER — Telehealth: Payer: Self-pay | Admitting: Family Medicine

## 2017-11-16 MED ORDER — AMPHETAMINE-DEXTROAMPHET ER 20 MG PO CP24: 20.0000 mg | ORAL_CAPSULE | Freq: Every day | ORAL | 0 refills | Status: DC

## 2017-11-16 NOTE — Telephone Encounter (Signed)
I refilled 1 month of her extended release. If she has scheduling issues like that- needs to go ahead and schedule 6 months out from time of visit. No further refills until visit- she needs to stretch out what she has perhaps not using on weekends. I did not refill the shorter acting one- we can do that at next visit.   Laura ConchStephen Alejo Beamer

## 2017-11-16 NOTE — Telephone Encounter (Signed)
See note

## 2017-11-16 NOTE — Telephone Encounter (Signed)
Copied from CRM [id number]. Topic: Quick Communication - Rx Refill/Question >> Nov 16, 2017  9:31 AM Maia Pettiesrtiz, Kristie S wrote: Medication: adderall - pt has 4 days on hand - scheduled f/u appt for 5/3 (pt needs a Friday appt and this was first available) Has the patient contacted their pharmacy? No -controlled Preferred Pharmacy (with phone number or street name): [id number], [place] - [address] [place] [address] [phone] (Phone) [phone] (Fax)

## 2017-12-21 ENCOUNTER — Ambulatory Visit: Payer: BLUE CROSS/BLUE SHIELD | Admitting: Family Medicine

## 2017-12-21 ENCOUNTER — Encounter: Payer: Self-pay | Admitting: Family Medicine

## 2017-12-21 VITALS — BP 108/78 | HR 61 | Temp 98.0°F | Ht 62.0 in | Wt 153.0 lb

## 2017-12-21 DIAGNOSIS — F988 Other specified behavioral and emotional disorders with onset usually occurring in childhood and adolescence: Secondary | ICD-10-CM | POA: Diagnosis not present

## 2017-12-21 MED ORDER — LISDEXAMFETAMINE DIMESYLATE 30 MG PO CAPS: 30.0000 mg | ORAL_CAPSULE | Freq: Every day | ORAL | 0 refills | Status: DC

## 2017-12-21 NOTE — Progress Notes (Signed)
Subjective:  Laura King is a 27 y.o. year old very pleasant female patient who presents for/with See problem oriented charting ROS- No chest pain or shortness of breath. No headache or blurry vision.  No unintentional weight loss   Past Medical History-  Patient Active Problem List   Diagnosis Date Noted  . Attention deficit disorder (ADD) in adult     Priority: Medium  . Irregular periods/menstrual cycles 11/16/2014    Priority: Low  . Family history of breast cancer 11/16/2014    Priority: Low  . Status post laparoscopy 03/29/2017    Medications- reviewed and updated Current Outpatient Medications  Medication Sig Dispense Refill  . etonogestrel (IMPLANON) 68 MG IMPL implant 1 each by Subdermal route once.    Marland Kitchen Ketorolac Tromethamine (SPRIX) 15.75 MG/SPRAY SOLN Place 1 spray into the nose as needed.    Marland Kitchen lisdexamfetamine (VYVANSE) 30 MG capsule Take 1 capsule (30 mg total) by mouth daily. May fill today 30 capsule 0  . lisdexamfetamine (VYVANSE) 30 MG capsule Take 1 capsule (30 mg total) by mouth daily. May fill in 30 days 30 capsule 0  . lisdexamfetamine (VYVANSE) 30 MG capsule Take 1 capsule (30 mg total) by mouth daily. May fill in 60 days 30 capsule 0   No current facility-administered medications for this visit.     Objective: BP 108/78 (BP Location: Left Arm, Patient Position: Sitting, Cuff Size: Normal)   Pulse 61   Temp 98 F (36.7 C) (Oral)   Ht  (1.575 m)   Wt 153 lb (69.4 kg)   LMP 12/03/2017   SpO2 99%   BMI 27.98 kg/m  Gen: NAD, resting comfortably CV: RRR no murmurs rubs or gallops Lungs: CTAB no crackles, wheeze, rhonchi Abdomen: soft/nontender/nondistended/normal bowel sounds.  Ext: no edema Skin: warm, dry Neuro: normal gait and speech  Assessment/Plan:  Attention deficit disorder (ADD) in adult S: continues to do well on extended release adderall . 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 Has worked well for her. Her brother and sister both on vyvanse and have done well with it with less of a crash after the adderrall.   03/28/16 controlled substance contract. Have records for diagnosis 05/12/08 [employer]. Have UDS within a year A/P: Regimen working well but feels some "crash" when medicine leaving system. She would like to try vyvanse. Discussed would trial at - if we need to increase dose would want her to come in for office visit- otherwise may fill in 3 months then see each other back at 6 months  6 month visit. CPE if she desires.   Meds ordered this encounter  Medications  . lisdexamfetamine (VYVANSE) 30 MG capsule    Sig: Take 1 capsule (30 mg total) by mouth daily. May fill today    Dispense:  30 capsule    Refill:  0  . lisdexamfetamine (VYVANSE) 30 MG capsule    Sig: Take 1 capsule (30 mg total) by mouth daily. May fill in 30 days    Dispense:  30 capsule    Refill:  0  . lisdexamfetamine (VYVANSE) 30 MG capsule    Sig: Take 1 capsule (30 mg total) by mouth daily. May fill in 60 days    Dispense:  30 capsule    Refill:  0    Return precautions advised.  Tana Conch, MD

## 2017-12-21 NOTE — Patient Instructions (Addendum)
Health Maintenance Due  Topic Date Due  . PAP SMEAR -Please call [facility] Gynecology at [phone] to schedule 11/15/2017   Your insurance likely pays for a physical here and at GYN. If they pay for one here- could schedule your 6 month visit as a physical (which should have no cost to you)  Trial vyvanse- see [place] back if not effective at this dose and we could consider trying  dose in 3 months

## 2017-12-21 NOTE — Assessment & Plan Note (Signed)
S: continues to do well on extended release adderall . 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 Has worked well for her. Her brother and sister both on vyvanse and have done well with it with less of a crash after the adderrall.   03/28/16 controlled substance contract. Have records for diagnosis 05/12/08 [employer]. Have UDS within a year A/P: Regimen working well but feels some "crash" when medicine leaving system. She would like to try vyvanse. Discussed would trial at - if we need to increase dose would want her to come in for office visit- otherwise may fill in 3 months then see each other back at 6 months

## 2018-01-01 ENCOUNTER — Other Ambulatory Visit: Payer: Self-pay | Admitting: Family Medicine

## 2018-01-01 NOTE — Telephone Encounter (Signed)
Attempted to call regarding how long she would be out of town as in regards to getting her refills for lisdexamfetamine 30 mg capsule . Left message for a call back to clarify.

## 2018-01-01 NOTE — Telephone Encounter (Signed)
Pt returned call - she will be out of town for 1 and half months.  She will need to have 2 rx's filled out of state

## 2018-01-01 NOTE — Telephone Encounter (Signed)
Vyvanse refill Last OV:12/21/17 Last refill:12/21/17 [id number] [id number], [place] - [address] [place] [address] [phone] (Phone) [phone] (Fax)

## 2018-01-01 NOTE — Telephone Encounter (Signed)
Copied from CRM [id number]. Topic: Quick Communication - Rx Refill/Question >> Jan 01, 2018  1:44 PM Alexander Bergeron B wrote: Medication: lisdexamfetamine (VYVANSE) 30 MG capsule [[id number]]  Pt is going out of town next week and is asking what steps she needs to take in order to get a Rx refill while she is out of town, call pt to advise

## 2018-01-02 NOTE — Telephone Encounter (Signed)
See note

## 2018-01-02 NOTE — Telephone Encounter (Signed)
I provided refills earlier this month for 3 months. It is not clear what is being requested by patient. It says she is going out of state- what is needed from me- prescriptions are already at pharmacy. Do they need approval to give her one early prescription?

## 2018-01-03 NOTE — Telephone Encounter (Signed)
See note

## 2018-01-03 NOTE — Telephone Encounter (Signed)
[facility] and spoke to [place], [facility]. He says the patient can receive hard copy prescriptions for 2 refills to take with her out of town to fill at a pharmacy of her choice or she can let us know where to send the prescriptions electronically. He says he will [facility]. If Dr. Durene Cal prefers to send hard copies, which the patient did not leave a pharmacy to send in previous messages, she will need to be called to come pick them up at the office. She leave going out of town next week and will be out of town for 1 1/2 months and will need 2 refills out of state as she stated in her previous call.

## 2018-01-05 NOTE — Telephone Encounter (Signed)
Alright team- lets print the one for may fill today and may fill in 30 days. Get me to sign them and then she can come by to pick these up.

## 2018-01-07 ENCOUNTER — Other Ambulatory Visit: Payer: Self-pay

## 2018-01-07 MED ORDER — LISDEXAMFETAMINE DIMESYLATE 30 MG PO CAPS: 30.0000 mg | ORAL_CAPSULE | Freq: Every day | ORAL | 0 refills | Status: DC

## 2018-01-07 NOTE — Telephone Encounter (Signed)
2 prescriptions printed out. I called [facility] and she will come by and pick them up

## 2018-03-27 ENCOUNTER — Telehealth: Payer: Self-pay | Admitting: Family Medicine

## 2018-03-27 NOTE — Telephone Encounter (Signed)
See note

## 2018-03-27 NOTE — Telephone Encounter (Signed)
Copied from CRM [id number]. Topic: Quick Communication - Rx Refill/Question >> Mar 27, 2018 12:31 PM Maia PettiesOrtiz, Kristie S wrote: Medication: pt is requesting to change from vyvanse back to adderall. She was taking 1 20mg  XR adderall and 1 10mg  immediate release per day. Vyvanse doesn't last as long for her. Please call to advise.  Preferred Pharmacy (with phone number or street name): [phone] [facility]- [place], [place] - [address] [phone] (Phone) [phone] (Fax)

## 2018-03-28 NOTE — Telephone Encounter (Signed)
We had discussed having her in for a visit if not effective and considering dose increase- I would advise this before switching back. Likely next step would be 40mg 

## 2018-03-29 NOTE — Telephone Encounter (Signed)
Patient scheduled for Monday, August 12

## 2018-04-01 ENCOUNTER — Encounter: Payer: Self-pay | Admitting: Family Medicine

## 2018-04-01 ENCOUNTER — Ambulatory Visit: Payer: BLUE CROSS/BLUE SHIELD | Admitting: Family Medicine

## 2018-04-01 VITALS — BP 106/76 | HR 61 | Temp 98.7°F | Ht 62.0 in | Wt 149.8 lb

## 2018-04-01 DIAGNOSIS — N946 Dysmenorrhea, unspecified: Secondary | ICD-10-CM

## 2018-04-01 DIAGNOSIS — F988 Other specified behavioral and emotional disorders with onset usually occurring in childhood and adolescence: Secondary | ICD-10-CM

## 2018-04-01 MED ORDER — KETOROLAC TROMETHAMINE 15.75 MG/SPRAY NA SOLN: 1.0000 | Freq: Four times a day (QID) | NASAL | 2 refills | Status: DC

## 2018-04-01 MED ORDER — LISDEXAMFETAMINE DIMESYLATE 40 MG PO CAPS: 40.0000 mg | ORAL_CAPSULE | ORAL | 0 refills | Status: DC

## 2018-04-01 NOTE — Assessment & Plan Note (Signed)
S: Vyvanse produced Similar benefits to adderrall but is getting more headaches (she wonders if perhaps she was more dehydrated as well). Runs out around 3 pm for her. She liked having the additional IR release adderrll to help her if she had extended work sessions.  A/P:Plan will be to trial vyvanse 40mg  for one month. If not effective, can go to 50mg  for one month- if that doesn't work switch back month 3 to Adderall . If headache increase with going up- we will also switch back to adderall.   Since we have planned these potential steps in office- none of the above will require office visit for transition. Can provide up to 6 months of medication of any of the above steps.

## 2018-04-01 NOTE — Patient Instructions (Addendum)
Health Maintenance Due  Topic Date Due  . PAP SMEAR -scheduled for November  11/15/2017   Plan will be to trial 40mg  for one month. If not effective, can go to 50mg  for one month- if that doesn't work switch back month 3. If headache increase with going up- we will also switch back.   If the ketorolac is expensive -call Dr. Mindi SlickerBanga to see if she can set up another refill. Only use during menstrual periods to cut down on long term risks to your kidneys and heart

## 2018-04-01 NOTE — Progress Notes (Signed)
Subjective:  Laura King is a 27 y.o. year old very pleasant female patient who presents for/with See problem oriented charting ROS- some headaches on vyvanse but also was doing some manual labor/work and wonders if deydrated. Strong menstrual cramps. No chest pain or shortness of breath reported   Past Medical History-  Patient Active Problem List   Diagnosis Date Noted  . Attention deficit disorder (ADD) in adult     Priority: Medium  . Irregular periods/menstrual cycles 11/16/2014    Priority: Low  . Family history of breast cancer 11/16/2014    Priority: Low  . Status post laparoscopy 03/29/2017    Medications- reviewed and updated Current Outpatient Medications  Medication Sig Dispense Refill  . etonogestrel (IMPLANON) 68 MG IMPL implant 1 each by Subdermal route once.    Marland Kitchen. Ketorolac Tromethamine (SPRIX) 15.75 MG/SPRAY SOLN Place 1 spray into the nose as needed.    Marland Kitchen. Ketorolac Tromethamine 15.75 MG/SPRAY SOLN Place 1 spray into the nose every 6 (six) hours. 1 each 2  . lisdexamfetamine (VYVANSE) 40 MG capsule Take 1 capsule (40 mg total) by mouth every morning. May fill today 30 capsule 0   No current facility-administered medications for this visit.     Objective: BP 106/76 (BP Location: Left Arm, Patient Position: Sitting, Cuff Size: Normal)   Pulse 61   Temp 98.7 F (37.1 C) (Oral)   Ht 5\' 2"  (1.575 m)   Wt 149 lb 12.8 oz (67.9 kg)   LMP 03/25/2018   SpO2 99%   BMI 27.40 kg/m  Gen: NAD, resting comfortably CV: RRR no murmurs rubs or gallops Lungs: CTAB no crackles, wheeze, rhonchi Ext: no edema Skin: warm, dry  Assessment/Plan:  Painful periods- Dr. Mindi SlickerBanga at ob/gyn has prescribed nasal nsaids. She has found this helpful- apparently it was free to her from the company. I told patient I would fill but I did not think it would be free and discussed possibly very expensive at pharmacy- she is going to reach out to Dr. Mindi SlickerBanga if expensive   Attention deficit  disorder (ADD) in adult S: Vyvanse produced Similar benefits to adderrall but is getting more headaches (she wonders if perhaps she was more dehydrated as well). Runs out around 3 pm for her. She liked having the additional IR release adderrll to help her if she had extended work sessions.  A/P:Plan will be to trial vyvanse 40mg  for one month. If not effective, can go to 50mg  for one month- if that doesn't work switch back month 3 to Adderall . If headache increase with going up- we will also switch back to adderall.   Since we have planned these potential steps in office- none of the above will require office visit for transition. Can provide up to 6 months of medication of any of the above steps.   Likely 6 month visit  Meds ordered this encounter  Medications  . lisdexamfetamine (VYVANSE) 40 MG capsule    Sig: Take 1 capsule (40 mg total) by mouth every morning. May fill today    Dispense:  30 capsule    Refill:  0  . Ketorolac Tromethamine 15.75 MG/SPRAY SOLN    Sig: Place 1 spray into the nose every 6 (six) hours.    Dispense:  1 each    Refill:  2    Return precautions advised.  Tana ConchStephen Chrisie Jankovich, MD

## 2018-04-06 ENCOUNTER — Other Ambulatory Visit: Payer: Self-pay | Admitting: Family Medicine

## 2018-05-10 ENCOUNTER — Telehealth: Payer: Self-pay

## 2018-05-10 NOTE — Telephone Encounter (Signed)
Copied from CRM [id number]. Topic: Quick Communication - Rx Refill/Question >> May 10, 2018 12:12 PM Crist InfanteHarrald, Kathy J wrote: Medication: amphetamine-dextroamphetamine (ADDERALL XR) 20 MG 24 hr capsule  amphetamine-dextroamphetamine (ADDERALL XR) 10 MG  Pt states she discussed at visit with Dr Durene CalHunter in August she would try the vyvanse mg, but it is just not working.  Too many side effects. Pt would like to switch back and refill the 2 rx for adderall.  [id number] [place], [place] - [address] [phone] (Phone) [phone] (Fax)

## 2018-05-10 NOTE — Telephone Encounter (Signed)
Yes I will send these in- can you send me the 2 separate adderall referrals x3 (immediate release and extended release from most recent fill)

## 2018-05-14 ENCOUNTER — Other Ambulatory Visit: Payer: Self-pay | Admitting: Family Medicine

## 2018-05-14 ENCOUNTER — Other Ambulatory Visit: Payer: Self-pay

## 2018-05-14 MED ORDER — AMPHETAMINE-DEXTROAMPHET ER 20 MG PO CP24: 20.0000 mg | ORAL_CAPSULE | ORAL | 0 refills | Status: DC

## 2018-05-14 MED ORDER — AMPHETAMINE-DEXTROAMPHETAMINE 10 MG PO TABS: 10.0000 mg | ORAL_TABLET | Freq: Every day | ORAL | 0 refills | Status: DC | PRN

## 2018-05-14 NOTE — Telephone Encounter (Signed)
Prescriptions sent to Dr. Durene CalHunter to review and send in as requested

## 2018-05-15 ENCOUNTER — Ambulatory Visit: Payer: Self-pay | Admitting: Family

## 2018-05-15 DIAGNOSIS — J028 Acute pharyngitis due to other specified organisms: Secondary | ICD-10-CM

## 2018-05-15 DIAGNOSIS — J029 Acute pharyngitis, unspecified: Secondary | ICD-10-CM

## 2018-05-15 DIAGNOSIS — B9789 Other viral agents as the cause of diseases classified elsewhere: Secondary | ICD-10-CM

## 2018-05-15 LAB — POCT RAPID STREP A (OFFICE): Rapid Strep A Screen: NEGATIVE

## 2018-05-15 MED ORDER — AMOXICILLIN 500 MG PO CAPS: 500.0000 mg | ORAL_CAPSULE | Freq: Two times a day (BID) | ORAL | 0 refills | Status: DC

## 2018-05-15 NOTE — Progress Notes (Signed)
Subjective:     Patient ID: Laura King, female   DOB: Oct 13, 1990, 27 y.o.   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 She reports feeling achy and feverish that began last night and worsened over the day  Review of Systems  Constitutional: Positive for chills and fever.  HENT: Positive for sore throat.   Respiratory: Negative.   Cardiovascular: Negative.   Endocrine: Negative.   Musculoskeletal: Negative.   Skin: Negative.   Allergic/Immunologic: Negative.   Psychiatric/Behavioral: Negative.    Past Medical History:  Diagnosis Date  . ADHD (attention deficit hyperactivity disorder)   . Arthritis    thumb  . Dysmenorrhea   . History of concussion    2011--  per pt no residual  . Menorrhagia     Social History   Socioeconomic History  . Marital status: Single    Spouse name: Not on file  . Number of children: Not on file  . Years of education: Not on file  . Highest education level: Not on file  Occupational History  . Not on file  Social Needs  . Financial resource strain: Not on file  . Food insecurity:    Worry: Not on file    Inability: Not on file  . Transportation needs:    Medical: Not on file    Non-medical: Not on file  Tobacco Use  . Smoking status: Never Smoker  . Smokeless tobacco: Never Used  Substance and Sexual Activity  . Alcohol use: Yes    Alcohol/week: 3.0 standard drinks    Types: 3 Standard drinks or equivalent per week    Comment: occasional  . Drug use: Not on file  . Sexual activity: Not on file  Lifestyle  . Physical activity:    Days per week: Not on file    Minutes per session: Not on file  . Stress: Not on file  Relationships  . Social connections:    Talks on phone: Not on file    Gets together: Not on file    Attends religious service: Not on file    Active member of club or organization: Not on file   Attends meetings of clubs or organizations: Not on file    Relationship status: Not on file  . Intimate partner violence:    Fear of current or ex partner: Not on file    Emotionally abused: Not on file    Physically abused: Not on file    Forced sexual activity: Not on file  Other Topics Concern  . Not on file  Social History Narrative   Single. Went to college in [place], [place] and had primary care doctor there. At [employer] grad school (final year)-may or may not be in [place] next year.       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       Hobbies: yugioh, make art, rides horses    Past Surgical History:  Procedure Laterality Date  . LAPAROSCOPY N/A 03/29/2017   Procedure: LAPAROSCOPY DIAGNOSTIC, peritoneal biopsy;  Surgeon: Edwinna Areola, DO;  Location: [facility] [place];  Service: Gynecology;  Laterality: N/A;  . WISDOM TOOTH EXTRACTION  2012    Family History  Problem Relation Age of Onset  . Breast cancer Mother        age  44, aunt in 52s, grandmother as well  . Alcohol abuse Father        father, now in [place]  . Endometriosis Sister   . Heart disease Unknown        paternal grandfather  . Hypertension Unknown        dad's family  . Depression Sister   . Bipolar disorder Sister        and eating disorder    No Known Allergies  Current Outpatient Medications on File Prior to Visit  Medication Sig Dispense Refill  . amphetamine-dextroamphetamine (ADDERALL XR) 20 MG 24 hr capsule Take 1 capsule (20 mg total) by mouth every morning. May Fill Now 30 capsule 0  . amphetamine-dextroamphetamine (ADDERALL XR) 20 MG 24 hr capsule Take 1 capsule (20 mg total) by mouth every morning. May Fill in 1 Month 30 capsule 0  . amphetamine-dextroamphetamine (ADDERALL) 10 MG tablet Take 1 tablet (10 mg total) by mouth daily as needed (8 hours after extended release version). 4x a week Maximum. May Fill Now 20 tablet 0  .  amphetamine-dextroamphetamine (ADDERALL) 10 MG tablet Take 1 tablet (10 mg total) by mouth daily as needed (8 hours after extended release version). 4x a week Maximum. May Fill in 1 Month 20 tablet 0  . etonogestrel (IMPLANON) 68 MG IMPL implant 1 each by Subdermal route once.    Marland Kitchen Ketorolac Tromethamine (SPRIX) 15.75 MG/SPRAY SOLN Place 1 spray into the nose as needed.    . SPRIX 15.75 MG/SPRAY SOLN PLACE 1 SPRAY INTO THE NOSE EVERY 6 HOURS. (Patient not taking: Reported on 05/15/2018) 5 each 0   No current facility-administered medications on file prior to visit.     There were no vitals taken for this visit.chart    Objective:   Physical Exam  Constitutional: She is oriented to person, place, and time. She appears well-developed and well-nourished.  HENT:  Right Ear: External ear normal.  Left Ear: External ear normal.  Mouth/Throat: Posterior oropharyngeal erythema present. No oropharyngeal exudate or posterior oropharyngeal edema.  Neck: Normal range of motion. Neck supple.  Cardiovascular: Normal rate and regular rhythm.  Pulmonary/Chest: Effort normal and breath sounds normal.  Musculoskeletal: Normal range of motion.  Neurological: She is alert and oriented to person, place, and time.  Skin: Skin is warm and dry.       Assessment:     [name] was seen today for sore throat.  Diagnoses and all orders for this visit:  Sore throat -     POCT rapid strep A  Sore throat (viral)  Other orders -     amoxicillin (AMOXIL) 500 MG capsule; Take 1 capsule (500 mg total) by mouth 2 (two) times daily.      Plan:     Sore throat is likely viral in nature. Since patient is concerned, I agreed to send over Amoxil if symptoms worsen, fever develops. Follow up with PCP as needed

## 2018-05-15 NOTE — Patient Instructions (Signed)

## 2018-05-17 ENCOUNTER — Telehealth: Payer: Self-pay

## 2018-05-17 NOTE — Telephone Encounter (Signed)
Patient states she is doing better.

## 2018-07-12 LAB — HM PAP SMEAR: HM Pap smear: NEGATIVE

## 2018-07-17 ENCOUNTER — Other Ambulatory Visit: Payer: Self-pay | Admitting: Family Medicine

## 2018-07-17 MED ORDER — AMPHETAMINE-DEXTROAMPHET ER 20 MG PO CP24: 20.0000 mg | ORAL_CAPSULE | ORAL | 0 refills | Status: DC

## 2018-07-17 MED ORDER — AMPHETAMINE-DEXTROAMPHETAMINE 10 MG PO TABS: 10.0000 mg | ORAL_TABLET | Freq: Every day | ORAL | 0 refills | Status: DC | PRN

## 2018-07-17 NOTE — Telephone Encounter (Signed)
Requested medication (s) are due for refill today: yes  Requested medication (s) are on the active medication list: yes  Last refill:  06/13/18 #30 Adderall 20 mg; 06/13/18 #20 Adderall 10 mg  Future visit scheduled: No  Notes to clinic:  Unable to fill per protocol    Requested Prescriptions  Pending Prescriptions Disp Refills   amphetamine-dextroamphetamine (ADDERALL XR) 20 MG 24 hr capsule 30 capsule 0    Sig: Take 1 capsule (20 mg total) by mouth every morning. May Fill Now     Not Delegated - Psychiatry:  Stimulants/ADHD Failed - 07/17/2018 12:29 PM      Failed - This refill cannot be delegated      Failed - Urine Drug Screen completed in last 360 days.      Failed - Valid encounter within last 3 months    Recent Outpatient Visits          3 months ago Attention deficit disorder (ADD) in adult   [facility] [place], Aldine ContesStephen O, MD   6 months ago Attention deficit disorder (ADD) in adult   [facility] [place] [place], Aldine ContesStephen O, MD   1 year ago Attention deficit disorder (ADD) in adult   [facility] Aldine ContesStephen O, MD   1 year ago Attention deficit disorder (ADD) in adult   [facility], Aldine ContesStephen O, MD   2 years ago ADD (attention deficit disorder)   [facility], Aldine ContesStephen O, MD            amphetamine-dextroamphetamine (ADDERALL) 10 MG tablet 20 tablet 0    Sig: Take 1 tablet (10 mg total) by mouth daily as needed (8 hours after extended release version). 4x a week Maximum. May Fill Now     Not Delegated - Psychiatry:  Stimulants/ADHD Failed - 07/17/2018 12:29 PM      Failed - This refill cannot be delegated      Failed - Urine Drug Screen completed in last 360 days.      Failed - Valid encounter within last 3 months    Recent Outpatient Visits          3 months ago Attention deficit disorder (ADD) in adult   [facility] [place] [facility]  Hunter, Aldine ContesStephen O, MD   6 months ago Attention deficit disorder (ADD) in adult   [facility] [place], Aldine ContesStephen O, MD   1 year ago Attention deficit disorder (ADD) in adult    [facility] Aldine ContesStephen O, MD   1 year ago Attention deficit disorder (ADD) in adult   [facility] Aldine ContesStephen O, MD   2 years ago ADD (attention deficit disorder)   [facility], Aldine ContesStephen O, MD

## 2018-07-17 NOTE — Telephone Encounter (Signed)
Copied from CRM #[id number]. Topic: Quick Communication - Rx Refill/Question >> Jul 17, 2018 12:24 PM Karielle Davidow B, NT wrote: Medication: amphetamine-dextroamphetamine (ADDERALL XR) 20 MG 24 hr capsule amphetamine-dextroamphetamine (ADDERALL) 10 MG tablet   Has the patient contacted their pharmacy? Yes.   (Agent: If no, request that the patient contact the pharmacy for the refill.) (Agent: If yes, when and what did the pharmacy advise?)  Preferred Pharmacy (with phone number or street name): [id number] - [place], [place] - [address]  Agent: Please be advised that RX refills may take up to 3 business days. We ask that you follow-up with your pharmacy.  

## 2018-07-17 NOTE — Telephone Encounter (Signed)
Copied from CRM [id number]. Topic: Quick Communication - Rx Refill/Question >> Jul 17, 2018 12:24 PM Laura King, Laura King, NT wrote: Medication: amphetamine-dextroamphetamine (ADDERALL XR) 20 MG 24 hr capsule amphetamine-dextroamphetamine (ADDERALL) 10 MG tablet   Has the patient contacted their pharmacy? Yes.   (Agent: If no, request that the patient contact the pharmacy for the refill.) (Agent: If yes, when and what did the pharmacy advise?)  Preferred Pharmacy (with phone number or street name): [id number] - [place], [place] - [address]  Agent: Please be advised that RX refills may take up to 3 business days. We ask that you follow-up with your pharmacy.

## 2018-08-19 ENCOUNTER — Ambulatory Visit: Payer: BLUE CROSS/BLUE SHIELD | Admitting: Family Medicine

## 2018-08-19 ENCOUNTER — Encounter: Payer: Self-pay | Admitting: Family Medicine

## 2018-08-19 DIAGNOSIS — F988 Other specified behavioral and emotional disorders with onset usually occurring in childhood and adolescence: Secondary | ICD-10-CM | POA: Diagnosis not present

## 2018-08-19 MED ORDER — AMPHETAMINE-DEXTROAMPHET ER 20 MG PO CP24: 20.0000 mg | ORAL_CAPSULE | Freq: Every day | ORAL | 0 refills | Status: DC

## 2018-08-19 MED ORDER — AMPHETAMINE-DEXTROAMPHETAMINE 10 MG PO TABS: 10.0000 mg | ORAL_TABLET | Freq: Every day | ORAL | 0 refills | Status: DC | PRN

## 2018-08-19 MED ORDER — AMPHETAMINE-DEXTROAMPHET ER 20 MG PO CP24: 20.0000 mg | ORAL_CAPSULE | ORAL | 0 refills | Status: DC

## 2018-08-19 NOTE — Patient Instructions (Addendum)
Health Maintenance Due  Topic Date Due  . PAP SMEAR-team please get a copy of this 11/15/2017   Refills for 3 months provided today electronially  If this still works, we can refill another 3 months in 3 months to get you to 6 month visit- will need to see you for further refills beyond that

## 2018-08-19 NOTE — Progress Notes (Signed)
Subjective:  Laura King is a 27 y.o. year old very pleasant female patient who presents for/with See problem oriented charting ROS- no unintentional weight loss, chest pain, shortness of breath. Edema.    Past Medical History-  Patient Active Problem List   Diagnosis Date Noted  . Attention deficit disorder (ADD) in adult     Priority: Medium  . Irregular periods/menstrual cycles 11/16/2014    Priority: Low  . Family history of breast cancer 11/16/2014    Priority: Low  . Status post laparoscopy 03/29/2017    Medications- reviewed and updated Current Outpatient Medications  Medication Sig Dispense Refill  . amphetamine-dextroamphetamine (ADDERALL XR) 20 MG 24 hr capsule Take 1 capsule (20 mg total) by mouth every morning. May Fill in 1 Month 30 capsule 0  . amphetamine-dextroamphetamine (ADDERALL XR) 20 MG 24 hr capsule Take 1 capsule (20 mg total) by mouth every morning. May Fill Now 30 capsule 0  . amphetamine-dextroamphetamine (ADDERALL) 10 MG tablet Take 1 tablet (10 mg total) by mouth daily as needed (8 hours after extended release version). 4x a week Maximum. May Fill in 1 Month 20 tablet 0  . amphetamine-dextroamphetamine (ADDERALL) 10 MG tablet Take 1 tablet (10 mg total) by mouth daily as needed (8 hours after extended release version). 4x a week Maximum. May Fill Now 20 tablet 0  . etonogestrel (IMPLANON) 68 MG IMPL implant 1 each by Subdermal route once.    Marland Kitchen. Ketorolac Tromethamine (SPRIX) 15.75 MG/SPRAY SOLN Place 1 spray into the nose as needed.    . SPRIX 15.75 MG/SPRAY SOLN PLACE 1 SPRAY INTO THE NOSE EVERY 6 HOURS. 5 each 0  . amphetamine-dextroamphetamine (ADDERALL XR) 20 MG 24 hr capsule Take 1 capsule (20 mg total) by mouth daily. May fill in 2 months. 30 capsule 0  . amphetamine-dextroamphetamine (ADDERALL) 10 MG tablet Take 1 tablet (10 mg total) by mouth daily as needed. (8 hours after extended release version) 4x a week maximum. May fill in 2 months 20 tablet 0    No current facility-administered medications for this visit.     Objective: BP 106/72 (BP Location: Left Arm, Patient Position: Sitting, Cuff Size: Large)   Pulse (!) 56   Temp 98.7 F (37.1 C) (Oral)   Ht 5\' 2"  (1.575 m)   Wt 152 lb 3.2 oz (69 kg)   LMP 08/11/2018   SpO2 97%   BMI 27.84 kg/m  Gen: NAD, resting comfortably CV: RRR no murmurs rubs or gallops Lungs: CTAB no crackles, wheeze, rhonchi Abdomen: soft/nontender/nondistended Ext: no edema Skin: warm, dry Neuro: Speech normal, moves all extremities  Assessment/Plan:  Attention deficit disorder (ADD) in adult S: Patient states she has done much better back on the Adderall 20 mg extended release on a daily basis with 4 times a week using the 10 mg instant release version.  This seems to work way better with her schedule than the Vyvanse did-Vyvanse simply did not last long enough for her even as we increase the dose.  She states she was having more frequent headaches on Vyvanse which have resolved off medication.  Still has some random mother mild headaches but has had those long-term. A/P: Stable-patient doing well-continue current medications - NCCSRS reviewed and no high risk utilization  - will need updated UDS next visit.  She had to be at work and 20 minutes in work was 20 minutes away so we did not have time to complete this today. -[facility] [employer] [facility] [facility]  Dr. Jonah Bluehip [employer] of psychology  8361-month refill can be provided.  Will need 3345-month visit.  Lab/Order associations: Attention deficit disorder (ADD) in adult  Meds ordered this encounter  Medications  . amphetamine-dextroamphetamine (ADDERALL XR) 20 MG 24 hr capsule    Sig: Take 1 capsule (20 mg total) by mouth every morning. May Fill in 1 Month    Dispense:  30 capsule    Refill:  0  . amphetamine-dextroamphetamine (ADDERALL XR) 20 MG 24 hr capsule    Sig: Take 1 capsule (20 mg total) by mouth every morning. May Fill Now     Dispense:  30 capsule    Refill:  0  . amphetamine-dextroamphetamine (ADDERALL) 10 MG tablet    Sig: Take 1 tablet (10 mg total) by mouth daily as needed (8 hours after extended release version). 4x a week Maximum. May Fill in 1 Month    Dispense:  20 tablet    Refill:  0  . amphetamine-dextroamphetamine (ADDERALL) 10 MG tablet    Sig: Take 1 tablet (10 mg total) by mouth daily as needed (8 hours after extended release version). 4x a week Maximum. May Fill Now    Dispense:  20 tablet    Refill:  0  . amphetamine-dextroamphetamine (ADDERALL XR) 20 MG 24 hr capsule    Sig: Take 1 capsule (20 mg total) by mouth daily. May fill in 2 months.    Dispense:  30 capsule    Refill:  0  . amphetamine-dextroamphetamine (ADDERALL) 10 MG tablet    Sig: Take 1 tablet (10 mg total) by mouth daily as needed. (8 hours after extended release version) 4x a week maximum. May fill in 2 months    Dispense:  20 tablet    Refill:  0    Time Stamp The duration of face-to-face time during this visit was greater than 15 minutes. Greater than 50% of this time was spent in counseling, explanation of diagnosis, planning of further management, and/or coordination of care including discussion of needed items for continuing prescribing-we have copy of original diagnosis- needs updated UDS but time prevented us from updating this today-review of NCCSRS together-no high risk utilization noted.     Return precautions advised.  Tana ConchStephen Astha Probasco, MD

## 2018-08-20 ENCOUNTER — Encounter: Payer: Self-pay | Admitting: Family Medicine

## 2018-08-20 NOTE — Assessment & Plan Note (Signed)
S: Patient states she has done much better back on the Adderall 20 mg extended release on a daily basis with 4 times a week using the 10 mg instant release version.  This seems to work way better with her schedule than the Vyvanse did-Vyvanse simply did not last long enough for her even as we increase the dose.  She states she was having more frequent headaches on Vyvanse which have resolved off medication.  Still has some random mother mild headaches but has had those long-term. A/P: Stable-patient doing well-continue current medications - NCCSRS reviewed and no high risk utilization  - will need updated UDS next visit.  She had to be at work and 20 minutes in work was 20 minutes away so we did not have time to complete this today. -[facility] [employer] [facility] Jonah Bluehip [employer] of psychology

## 2018-09-06 ENCOUNTER — Ambulatory Visit (INDEPENDENT_AMBULATORY_CARE_PROVIDER_SITE_OTHER): Payer: BC Managed Care – PPO | Admitting: Physician Assistant

## 2018-09-06 ENCOUNTER — Encounter: Payer: Self-pay | Admitting: Physician Assistant

## 2018-09-06 VITALS — BP 110/70 | HR 59 | Temp 98.9°F | Ht 62.0 in | Wt 153.0 lb

## 2018-09-06 DIAGNOSIS — R591 Generalized enlarged lymph nodes: Secondary | ICD-10-CM

## 2018-09-06 MED ORDER — DOXYCYCLINE HYCLATE 100 MG PO TABS: 100.0000 mg | ORAL_TABLET | Freq: Two times a day (BID) | ORAL | 0 refills | Status: DC

## 2018-09-06 NOTE — Patient Instructions (Addendum)
It was great to see you!  Please return if the area is still present after two weeks.  Start oral doxycycline if begins to look infected.  Lymphadenopathy  Lymphadenopathy means that your lymph glands are swollen or larger than normal (enlarged). Lymph glands, also called lymph nodes, are collections of tissue that filter bacteria, viruses, and waste from your bloodstream. They are part of your body's disease-fighting system (immune system), which protects your body from germs. There may be different causes of lymphadenopathy, depending on where it is in your body. Some types go away on their own. Lymphadenopathy can occur anywhere that you have lymph glands, including these areas:  Neck (cervical lymphadenopathy).  Chest (mediastinal lymphadenopathy).  Lungs (hilar lymphadenopathy).  Underarms (axillary lymphadenopathy).  Groin (inguinal lymphadenopathy). When your immune system responds to germs, infection-fighting cells and fluid build up in your lymph glands. This causes some swelling and enlargement. If the lymph glands do not go back to normal after you have an infection or disease, your health care provider may do tests. These tests help to monitor your condition and find the reason why the glands are still swollen and enlarged. Follow these instructions at home:  Get plenty of rest.  Take over-the-counter and prescription medicines only as told by your health care provider. Your health care provider may recommend over-the-counter medicines for pain.  If directed, apply heat to swollen lymph glands as often as told by your health care provider. Use the heat source that your health care provider recommends, such as a moist heat pack or a heating pad. ? Place a towel between your skin and the heat source. ? Leave the heat on for 20-30 minutes. ? Remove the heat if your skin turns bright red. This is especially important if you are unable to feel pain, heat, or cold. You may have a  greater risk of getting burned.  Check your affected lymph glands every day for changes. Check other lymph gland areas as told by your health care provider. Check for changes such as: ? More swelling. ? Sudden increase in size. ? Redness or pain. ? Hardness.  Keep all follow-up visits as told by your health care provider. This is important. Contact a health care provider if you have:  Swelling that gets worse or spreads to other areas.  Problems with breathing.  Lymph glands that: ? Are still swollen after 2 weeks. ? Have suddenly gotten bigger. ? Are red, painful, or hard.  A fever or chills.  Fatigue.  A sore throat.  Pain in your abdomen.  Weight loss.  Night sweats. Get help right away if you have:  Fluid leaking from an enlarged lymph gland.  Severe pain.  Chest pain.  Shortness of breath. Summary  Lymphadenopathy means that your lymph glands are swollen or larger than normal (enlarged).  Lymph glands (also called lymph nodes) are collections of tissue that filter bacteria, viruses, and waste from the bloodstream. They are part of your body's disease-fighting system (immune system).  Lymphadenopathy can occur anywhere that you have lymph glands.  If your enlarged and swollen lymph glands do not go back to normal after you have an infection or disease, your health care provider may do tests to monitor your condition and find the reason why the glands are still swollen and enlarged.  Check your affected lymph glands every day for changes. Check other lymph gland areas as told by your health care provider. This information is not intended to replace advice given  to you by your health care provider. Make sure you discuss any questions you have with your health care provider. Document Released: 05/16/2008 Document Revised: 06/22/2017 Document Reviewed: 06/22/2017 Elsevier Interactive Patient Education  2019 [employer]

## 2018-09-06 NOTE — Progress Notes (Signed)
Laura King is a 28 y.o. female here for a new problem.  I acted as a Neurosurgeon for [employer], PA-C Corky Mull, LPN  History of Present Illness:   Chief Complaint  Patient presents with  . Mass    HPI   Left Axilla mass Pt found lump under left axilla 3 days ago, area slightly red and swollen. Pt having some mild pain 1-2/10. No drainage noted. Denies any breast tenderness, nipple changes, breast lumps. Uses clean razor when shaving axilla. Denies any lesions or discharge. Denies history of this in the past. Denies: unusual fevers, night sweats, unintentional weight changes.  Past Medical History:  Diagnosis Date  . ADHD (attention deficit hyperactivity disorder)   . Arthritis    thumb  . Dysmenorrhea   . History of concussion    2011--  per pt no residual  . Menorrhagia      Social History   Socioeconomic History  . Marital status: Single    Spouse name: Not on file  . Number of children: Not on file  . Years of education: Not on file  . Highest education level: Not on file  Occupational History  . Not on file  Social Needs  . Financial resource strain: Not on file  . Food insecurity:    Worry: Not on file    Inability: Not on file  . Transportation needs:    Medical: Not on file    Non-medical: Not on file  Tobacco Use  . Smoking status: Never Smoker  . Smokeless tobacco: Never Used  Substance and Sexual Activity  . Alcohol use: Yes    Alcohol/week: 3.0 standard drinks    Types: 3 Standard drinks or equivalent per week    Comment: occasional  . Drug use: Not on file  . Sexual activity: Not on file  Lifestyle  . Physical activity:    Days per week: Not on file    Minutes per session: Not on file  . Stress: Not on file  Relationships  . Social connections:    Talks on phone: Not on file    Gets together: Not on file    Attends religious service: Not on file    Active member of club or organization: Not on file    Attends meetings of clubs or  organizations: Not on file    Relationship status: Not on file  . Intimate partner violence:    Fear of current or ex partner: Not on file    Emotionally abused: Not on file    Physically abused: Not on file    Forced sexual activity: Not on file  Other Topics Concern  . Not on file  Social History Narrative   Single. Went to college in [place], [place] and had primary care doctor there. At [employer] grad school (final year)-may or may not be in [place] next year.       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       Hobbies: yugioh, make art, rides horses    Past Surgical History:  Procedure Laterality Date  . LAPAROSCOPY N/A 03/29/2017   Procedure: LAPAROSCOPY DIAGNOSTIC, peritoneal biopsy;  Surgeon: Edwinna Areola, DO;  Location: [facility];  Service: Gynecology;  Laterality: N/A;  . WISDOM TOOTH EXTRACTION  2012    Family History  Problem Relation Age of Onset  . Breast cancer Mother        age  30, aunt in 8040s, grandmother as well  . Alcohol abuse Father        father, now in [place]  . Endometriosis Sister   . Heart disease Other        paternal grandfather  . Hypertension Other        dad's family  . Depression Sister   . Bipolar disorder Sister        and eating disorder    No Known Allergies  Current Medications:   Current Outpatient Medications:  .  amphetamine-dextroamphetamine (ADDERALL XR) 20 MG 24 hr capsule, Take 1 capsule (20 mg total) by mouth every morning. May Fill in 1 Month, Disp: 30 capsule, Rfl: 0 .  amphetamine-dextroamphetamine (ADDERALL XR) 20 MG 24 hr capsule, Take 1 capsule (20 mg total) by mouth every morning. May Fill Now, Disp: 30 capsule, Rfl: 0 .  amphetamine-dextroamphetamine (ADDERALL XR) 20 MG 24 hr capsule, Take 1 capsule (20 mg total) by mouth daily. May fill in 2 months., Disp: 30 capsule, Rfl: 0 .  amphetamine-dextroamphetamine (ADDERALL) 10 MG tablet, Take 1 tablet (10 mg total) by mouth  daily as needed (8 hours after extended release version). 4x a week Maximum. May Fill in 1 Month, Disp: 20 tablet, Rfl: 0 .  amphetamine-dextroamphetamine (ADDERALL) 10 MG tablet, Take 1 tablet (10 mg total) by mouth daily as needed (8 hours after extended release version). 4x a week Maximum. May Fill Now, Disp: 20 tablet, Rfl: 0 .  amphetamine-dextroamphetamine (ADDERALL) 10 MG tablet, Take 1 tablet (10 mg total) by mouth daily as needed. (8 hours after extended release version) 4x a week maximum. May fill in 2 months, Disp: 20 tablet, Rfl: 0 .  etonogestrel (IMPLANON) 68 MG IMPL implant, 1 each by Subdermal route once., Disp: , Rfl:  .  doxycycline (VIBRA-TABS) 100 MG tablet, Take 1 tablet (100 mg total) by mouth 2 (two) times daily., Disp: 20 tablet, Rfl: 0 .  SPRIX 15.75 MG/SPRAY SOLN, PLACE 1 SPRAY INTO THE NOSE EVERY 6 HOURS. (Patient not taking: Reported on 09/06/2018), Disp: 5 each, Rfl: 0   Review of Systems:   ROS Negative unless otherwise specified per HPI.  Vitals:   Vitals:   09/06/18 1330  BP: 110/70  Pulse: (!) 59  Temp: 98.9 F (37.2 C)  TempSrc: Oral  SpO2: 98%  Weight: 153 lb (69.4 kg)  Height: 5\' 2"  (1.575 m)     Body mass index is 27.98 kg/m.  Physical Exam:   Physical Exam Vitals signs and nursing note reviewed.  Constitutional:      General: She is not in acute distress.    Appearance: She is well-developed. She is not ill-appearing or toxic-appearing.  Cardiovascular:     Rate and Rhythm: Normal rate and regular rhythm.     Pulses: Normal pulses.     Heart sounds: Normal heart sounds, S1 normal and S2 normal.     Comments: No LE edema Pulmonary:     Effort: Pulmonary effort is normal.     Breath sounds: Normal breath sounds.  Lymphadenopathy:     Upper Body:     Left upper body: Axillary adenopathy (tender with palpation) present.  Skin:    General: Skin is warm and dry.     Comments: No erythema or warmth to L axilla  Neurological:     Mental  Status: She is alert.     GCS: GCS eye subscore is 4. GCS verbal subscore is 5. GCS motor subscore is 6.  Psychiatric:        Speech: Speech normal.        Behavior: Behavior normal. Behavior is cooperative.     Assessment and Plan:   Belenda CruiseKristin was seen today for mass.  Diagnoses and all orders for this visit:  Lymphadenopathy Will have patient monitor symptoms, if area is present after two weeks, recommend patient return to office in two weeks for further evaluation. If for some reason area appears to become infected -- especially with erythema, fever or drainage, advised patient to start oral doxycycline (safety net prescription given.)  Other orders -     doxycycline (VIBRA-TABS) 100 MG tablet; Take 1 tablet (100 mg total) by mouth 2 (two) times daily.  . Reviewed expectations re: course of current medical issues. . Discussed self-management of symptoms. . Outlined signs and symptoms indicating need for more acute intervention. . Patient verbalized understanding and all questions were answered. . See orders for this visit as documented in the electronic medical record. . Patient received an After-Visit Summary.  CMA or LPN served as scribe during this visit. History, Physical, and Plan performed by medical provider. The above documentation has been reviewed and is accurate and complete.  Jarold MottoSamantha Judah Carchi, PA-C

## 2018-09-09 ENCOUNTER — Ambulatory Visit: Payer: BLUE CROSS/BLUE SHIELD | Admitting: Physician Assistant

## 2018-09-17 ENCOUNTER — Telehealth: Payer: Self-pay

## 2018-09-17 NOTE — Telephone Encounter (Signed)
Prior Authorization submitted for amphetamine-dextroamphetamine (ADDERALL) 10 MG tablet.  Jenkins Rouge (Key: [id number])   Your demographic data has been sent to the plan successfully. They will respond with your clinical questions and you will be notified by email when available within the next business day. You can also check for an update later by opening this request from your dashboard. Please do not fax or call the plan to resubmit this request. If you need assistance, please chat with CoverMyMeds or call us at [phone].

## 2018-09-23 NOTE — Telephone Encounter (Signed)
Jenkins Rouge (Key: [id number])   This request has received a N/A outcome. Please note any additional information provided by [employer] at the bottom of this request.

## 2018-11-25 ENCOUNTER — Other Ambulatory Visit: Payer: Self-pay | Admitting: Family Medicine

## 2018-11-25 NOTE — Telephone Encounter (Signed)
Please advise on refill request

## 2018-11-26 ENCOUNTER — Ambulatory Visit (INDEPENDENT_AMBULATORY_CARE_PROVIDER_SITE_OTHER): Payer: BC Managed Care – PPO | Admitting: Family Medicine

## 2018-11-26 ENCOUNTER — Encounter: Payer: Self-pay | Admitting: Family Medicine

## 2018-11-26 VITALS — Temp 97.6°F | Ht 62.0 in | Wt 153.0 lb

## 2018-11-26 DIAGNOSIS — F988 Other specified behavioral and emotional disorders with onset usually occurring in childhood and adolescence: Secondary | ICD-10-CM | POA: Diagnosis not present

## 2018-11-26 MED ORDER — AMPHETAMINE-DEXTROAMPHETAMINE 10 MG PO TABS: 10.0000 mg | ORAL_TABLET | Freq: Every day | ORAL | 0 refills | Status: DC | PRN

## 2018-11-26 MED ORDER — AMPHETAMINE-DEXTROAMPHET ER 20 MG PO CP24: 20.0000 mg | ORAL_CAPSULE | ORAL | 0 refills | Status: DC

## 2018-11-26 MED ORDER — AMPHETAMINE-DEXTROAMPHET ER 20 MG PO CP24: 20.0000 mg | ORAL_CAPSULE | Freq: Every day | ORAL | 0 refills | Status: DC

## 2018-11-26 NOTE — Progress Notes (Signed)
Phone [phone]   Subjective:  Virtual visit via Video note This visit type was conducted due to national recommendations for restrictions regarding the COVID-19 Pandemic (e.g. social distancing).  This format is felt to be most appropriate for this patient at this time balancing risks to patient and risks to population by having him in for in person visit.  All issues noted in this document were discussed and addressed.  No physical exam was performed (except for noted visual exam or audio findings with Telehealth visits).  The patient has consented to conduct a Telehealth visit and understands insurance will be billed.   Our team/I connected with Laura King on 11/26/18 at  1:20 PM EDT by a video enabled telemedicine application ([email or web address]) and verified that I am speaking with the correct person using two identifiers.  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 In light of current covid-19 pandemic, patient also understands that we are trying to protect them by minimizing in office contact if at all possible.  The patient expressed consent for telemedicine visit and agreed to proceed. Patient understands insurance will be billed.   ROS- No chest pain or shortness of breath. No headache or blurry vision. No unintentional weight loss reported. Some stress.    Past Medical History-  Patient Active Problem List   Diagnosis Date Noted  . Attention deficit disorder (ADD) in adult     Priority: Medium  . Irregular periods/menstrual cycles 11/16/2014    Priority: Low  . Family history of breast cancer 11/16/2014    Priority: Low  . Status post laparoscopy 03/29/2017    Medications- reviewed and updated Current Outpatient  Medications  Medication Sig Dispense Refill  . amphetamine-dextroamphetamine (ADDERALL XR) 20 MG 24 hr capsule Take 1 capsule (20 mg total) by mouth every morning. May Fill in 1 Month 30 capsule 0  . amphetamine-dextroamphetamine (ADDERALL XR) 20 MG 24 hr capsule Take 1 capsule (20 mg total) by mouth every morning. May Fill Now 30 capsule 0  . amphetamine-dextroamphetamine (ADDERALL XR) 20 MG 24 hr capsule Take 1 capsule (20 mg total) by mouth daily. May fill in 2 months. 30 capsule 0  . amphetamine-dextroamphetamine (ADDERALL) 10 MG tablet Take 1 tablet (10 mg total) by mouth daily as needed (8 hours after extended release version). 4x a week Maximum. May Fill in 1 Month 20 tablet 0  . amphetamine-dextroamphetamine (ADDERALL) 10 MG tablet Take 1 tablet (10 mg total) by mouth daily as needed (8 hours after extended release version). 4x a week Maximum. May Fill Now 20 tablet 0  . amphetamine-dextroamphetamine (ADDERALL) 10 MG tablet Take 1 tablet (10 mg total) by mouth daily as needed. (8 hours after extended release version) 4x a week maximum. May fill in 2 months 20 tablet 0  . etonogestrel (IMPLANON) 68 MG IMPL implant 1 each by Subdermal route once.    . SPRIX 15.75 MG/SPRAY SOLN PLACE 1 SPRAY INTO THE NOSE EVERY 6 HOURS. (Patient not taking: Reported on 11/26/2018) 5 each 0   No current facility-administered medications for this visit.      Objective:  Temp 97.6 F (36.4 C) (Oral)   Ht 5\' 2"  (1.575 m)   Wt 153 lb (69.4 kg)   BMI 27.98 kg/m  Gen: NAD, resting comfortably Lungs: nonlabored, normal respiratory  rate - out walking in junkyard without difficulty Skin: appears dry, no obvious rash     Assessment and Plan   # ADD S:well controlled on adderall 20 mg extended release for the most part.  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 A/P: Well-controlled-continue current medication -Reviewed [facility]-last refill was 2 months ago- only receiving prescriptions through me - [facility] diagnosis [facility]12/[facility] by Dr. Jonah Bluehip Studwell of psychology -Plan was for UDS this visit but we are try to keep you out of the office if at all possible due to COVID-19-we will delay till next visit - 6571-month refill can be provided.  Will need 5521-month in person visit-could likely do as physical  8571-month follow-up  Lab/Order associations: Attention deficit disorder (ADD) in adult  Meds ordered this encounter  Medications  . amphetamine-dextroamphetamine (ADDERALL XR) 20 MG 24 hr capsule    Sig: Take 1 capsule (20 mg total) by mouth every morning. May Fill in 1 Month    Dispense:  30 capsule    Refill:  0  . amphetamine-dextroamphetamine (ADDERALL XR) 20 MG 24 hr capsule    Sig: Take 1 capsule (20 mg total) by mouth every morning. May Fill Now    Dispense:  30 capsule    Refill:  0  . amphetamine-dextroamphetamine (ADDERALL XR) 20 MG 24 hr capsule    Sig: Take 1 capsule (20 mg total) by mouth daily. May fill in 2 months.    Dispense:  30 capsule    Refill:  0  . amphetamine-dextroamphetamine (ADDERALL) 10 MG tablet    Sig: Take 1 tablet (10 mg total) by mouth daily as needed (8 hours after extended release version). 4x a week Maximum. May Fill in 1 Month    Dispense:  20 tablet    Refill:  0  . amphetamine-dextroamphetamine (ADDERALL) 10 MG tablet    Sig: Take 1 tablet (10 mg total) by mouth daily as needed (8 hours after extended release version). 4x a week Maximum. May Fill Now    Dispense:  20 tablet    Refill:  0  . amphetamine-dextroamphetamine (ADDERALL) 10 MG tablet    Sig: Take 1 tablet (10 mg total) by mouth daily as  needed. (8 hours after extended release version) 4x a week maximum. May fill in 2 months    Dispense:  20 tablet    Refill:  0    Return precautions advised.  Tana ConchStephen Kinslee Dalpe, MD

## 2018-11-26 NOTE — Telephone Encounter (Signed)
Maddy, schedule doxy appointment due to no refills remaining  °

## 2018-11-26 NOTE — Patient Instructions (Addendum)
Video visit

## 2019-03-26 ENCOUNTER — Other Ambulatory Visit: Payer: Self-pay | Admitting: Family Medicine

## 2019-03-26 NOTE — Telephone Encounter (Signed)
Last OV 11/26/18 Last refill 11/26/18 #30/2 Next OV not schedule  Forwarding to Dr. Yong Channel

## 2019-03-26 NOTE — Telephone Encounter (Signed)
Copied from [place] [phone]. Topic: Quick Communication - Rx Refill/Question >> Mar 26, 2019 11:26 AM Leward Quan A wrote: Medication: amphetamine-dextroamphetamine (ADDERALL XR) 20 MG 24 hr capsule   Has the patient contacted their pharmacy? Yes.   (Agent: If no, request that the patient contact the pharmacy for the refill.) (Agent: If yes, when and what did the pharmacy advise?)  Preferred Pharmacy (with phone number or street name): [id number] - [place], [place] [phone] (Phone) [phone] (Fax)    Agent: Please be advised that RX refills may take up to 3 business days. We ask that you follow-up with your pharmacy.

## 2019-03-27 MED ORDER — AMPHETAMINE-DEXTROAMPHET ER 20 MG PO CP24: 20.0000 mg | ORAL_CAPSULE | Freq: Every day | ORAL | 0 refills | Status: DC

## 2019-06-16 ENCOUNTER — Other Ambulatory Visit: Payer: Self-pay

## 2019-06-16 ENCOUNTER — Other Ambulatory Visit: Payer: Self-pay | Admitting: Family Medicine

## 2019-06-16 MED ORDER — AMPHETAMINE-DEXTROAMPHET ER 20 MG PO CP24: 20.0000 mg | ORAL_CAPSULE | Freq: Every day | ORAL | 0 refills | Status: DC

## 2019-06-16 MED ORDER — AMPHETAMINE-DEXTROAMPHETAMINE 10 MG PO TABS: 10.0000 mg | ORAL_TABLET | Freq: Every day | ORAL | 0 refills | Status: DC | PRN

## 2019-06-16 NOTE — Telephone Encounter (Signed)
Last app 11/26/2018 Last script  adderall xr 20 03/27/2019 adderall 10 IR 11/26/2018  I have made a med f/u with patient virtual on 06/26/2019 Ok to fill?

## 2019-06-16 NOTE — Telephone Encounter (Signed)
See note

## 2019-06-16 NOTE — Telephone Encounter (Signed)
Medication: amphetamine-dextroamphetamine (ADDERALL XR) 20 MG 24 hr capsule [[id number]] , amphetamine-dextroamphetamine (ADDERALL) 10 MG tablet [[id number]]   Has the patient contacted their pharmacy? YES  (Agent: If no, request that the patient contact the pharmacy for the refill.) (Agent: If yes, when and what did the pharmacy advise?)  Preferred Pharmacy (with phone number or street name): [id number] - [place], [place] [phone] (Phone) [phone] (Fax)   Agent: Please be advised that RX refills may take up to 3 business days. We ask that you follow-up with your pharmacy.

## 2019-06-20 ENCOUNTER — Ambulatory Visit (INDEPENDENT_AMBULATORY_CARE_PROVIDER_SITE_OTHER): Payer: BC Managed Care – PPO | Admitting: Family Medicine

## 2019-06-20 ENCOUNTER — Encounter: Payer: Self-pay | Admitting: Family Medicine

## 2019-06-20 VITALS — HR 55 | Ht 62.0 in | Wt 153.0 lb

## 2019-06-20 DIAGNOSIS — F988 Other specified behavioral and emotional disorders with onset usually occurring in childhood and adolescence: Secondary | ICD-10-CM

## 2019-06-20 MED ORDER — AMPHETAMINE-DEXTROAMPHET ER 20 MG PO CP24: 20.0000 mg | ORAL_CAPSULE | Freq: Every day | ORAL | 0 refills | Status: DC

## 2019-06-20 MED ORDER — AMPHETAMINE-DEXTROAMPHETAMINE 10 MG PO TABS: 20.0000 mg | ORAL_TABLET | Freq: Every day | ORAL | 0 refills | Status: DC | PRN

## 2019-06-20 MED ORDER — AMPHETAMINE-DEXTROAMPHETAMINE 10 MG PO TABS: 10.0000 mg | ORAL_TABLET | Freq: Every day | ORAL | 0 refills | Status: DC | PRN

## 2019-06-20 MED ORDER — AMPHETAMINE-DEXTROAMPHET ER 20 MG PO CP24: 20.0000 mg | ORAL_CAPSULE | ORAL | 0 refills | Status: DC

## 2019-06-20 NOTE — Progress Notes (Signed)
Phone [phone]  Subjective:  Virtual visit via Video note. Chief complaint: Chief Complaint  Patient presents with  . Medication Refill     This visit type was conducted due to national recommendations for restrictions regarding the COVID-19 Pandemic (e.g. social distancing).  This format is felt to be most appropriate for this patient at this time balancing risks to patient and risks to population by having him in for in person visit.  No physical exam was performed (except for noted visual exam or audio findings with Telehealth visits).    Our team/I connected with Laura King at  3:00 PM EDT by a video enabled telemedicine application (doxy.me or caregility through epic) and verified that I am speaking with the correct person using two identifiers.  Location patient: [place] Location provider: [facility], office Persons participating in the virtual visit:  patient  Our team/I discussed the limitations of evaluation and management by telemedicine and the availability of in person appointments. In light of current covid-19 pandemic, patient also understands that we are trying to protect them by minimizing in office contact if at all possible.  The patient expressed consent for telemedicine visit and agreed to proceed. Patient understands insurance will be billed.   ROS- No chest pain or shortness of breath. No headache or blurry vision.   Past Medical History-  Patient Active Problem List   Diagnosis Date Noted  . Attention deficit disorder (ADD) in adult     Priority: Medium  . Irregular periods/menstrual cycles 11/16/2014    Priority: Low  . Family history of breast cancer 11/16/2014    Priority: Low  . Status post laparoscopy 03/29/2017    Medications- reviewed and updated Current Outpatient Medications  Medication Sig Dispense Refill  . amphetamine-dextroamphetamine (ADDERALL XR) 20 MG 24 hr capsule Take 1 capsule (20 mg total) by mouth daily. May fill in 1 month past  last prescription 30 capsule 0  . etonogestrel (IMPLANON) 68 MG IMPL implant 1 each by Subdermal route once.    Marland Kitchen amphetamine-dextroamphetamine (ADDERALL XR) 20 MG 24 hr capsule Take 1 capsule (20 mg total) by mouth daily. May fill in 2 months past last prescription 30 capsule 0  . amphetamine-dextroamphetamine (ADDERALL XR) 20 MG 24 hr capsule Take 1 capsule (20 mg total) by mouth every morning. May fill in 3 months past last prescription 30 capsule 0  . amphetamine-dextroamphetamine (ADDERALL) 10 MG tablet Take 1 tablet (10 mg total) by mouth daily as needed (at least 8 hours after extended release dose). 20 tablet 0  . [START ON 07/20/2019] amphetamine-dextroamphetamine (ADDERALL) 10 MG tablet Take 1 tablet (10 mg total) by mouth daily as needed (Patient currently taking Adderall 20 xr as well as Adderall 10mg ). 20 tablet 0  . [START ON 08/19/2019] amphetamine-dextroamphetamine (ADDERALL) 10 MG tablet Take 2 tablets (20 mg total) by mouth daily as needed (Patient currently taking Adderall 20 xr as well as Adderall 10mg ). 30 tablet 0  . SPRIX 15.75 MG/SPRAY SOLN PLACE 1 SPRAY INTO THE NOSE EVERY 6 HOURS. (Patient not taking: Reported on 06/20/2019) 5 each 0   No current facility-administered medications for this visit.      Objective:  Pulse (!) 55   Ht 5\' 2"  (1.575 m)   Wt 153 lb (69.4 kg)   LMP 06/09/2019   BMI 27.98 kg/m  self reported vitals Gen: NAD, resting comfortably Lungs: nonlabored, normal respiratory rate  Skin: appears dry, no obvious rash    Assessment and Plan   #  Attention deficit disorder (ADD) in adult S: Patient currently taking Adderall 20 xr as well as Adderall 10mg . XR version helps her throughout the day and uses 10mg  when has extended work days. 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  Got a fitbit for her birthday and is helping her at least keep moving.   Since the last visit has the patient had any: Appetite changes? No Unintentional weight loss? No Is medication working well ? Yes Does patient take drug holidays? No Difficulties falling to sleep or maintaining sleep? No Any anxiety?  No Any cardiac issues (fainting or paliptations)? No Suicidal thoughts? No Changes in health since last visit? No New medications? No Any illicit substance abuse? No Has the patient taken his medication today? Yes  A/P: ADD adult- stable doing well. 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 Duanne Moron of psychology -Plan was for UDS this visit b but will do at CPE  Recommended follow up: 6 month CPE and update labs  Lab/Order associations:   ICD-10-CM   1. Attention deficit disorder (ADD) in adult  F98.8     Meds ordered this encounter  Medications  . amphetamine-dextroamphetamine (ADDERALL XR) 20 MG 24 hr capsule    Sig: Take 1 capsule (20 mg total) by mouth daily. May fill in 2 months past last prescription    Dispense:  30 capsule    Refill:  0  . amphetamine-dextroamphetamine (ADDERALL XR) 20 MG 24 hr capsule    Sig: Take 1 capsule (20 mg total) by mouth daily. May fill in 1 month past last prescription    Dispense:  30 capsule    Refill:  0  . amphetamine-dextroamphetamine (ADDERALL XR) 20 MG 24 hr capsule    Sig: Take 1 capsule (20 mg total) by mouth every morning. May fill in 3 months past last prescription    Dispense:  30 capsule    Refill:  0  . amphetamine-dextroamphetamine (ADDERALL) 10 MG tablet     Sig: Take 1 tablet (10 mg total) by mouth daily as needed (at least 8 hours after extended release dose).    Dispense:  20 tablet    Refill:  0  . amphetamine-dextroamphetamine (ADDERALL) 10 MG tablet    Sig: Take 1 tablet (10 mg total) by mouth daily as needed (Patient currently taking Adderall 20 xr as well as Adderall 10mg ).    Dispense:  20 tablet    Refill:  0  . amphetamine-dextroamphetamine (ADDERALL) 10 MG tablet    Sig: Take 2 tablets (20 mg total) by mouth daily as needed (Patient currently taking Adderall 20 xr as well as Adderall 10mg ).    Dispense:  30 tablet    Refill:  0    Return precautions advised.  Garret Reddish, MD

## 2019-06-26 ENCOUNTER — Ambulatory Visit: Payer: BC Managed Care – PPO | Admitting: Family Medicine

## 2019-07-12 ENCOUNTER — Other Ambulatory Visit: Payer: Self-pay

## 2019-07-12 DIAGNOSIS — Z20822 Contact with and (suspected) exposure to covid-19: Secondary | ICD-10-CM

## 2019-07-14 LAB — NOVEL CORONAVIRUS, NAA: SARS-CoV-2, NAA: NOT DETECTED

## 2019-09-22 ENCOUNTER — Telehealth: Payer: Self-pay | Admitting: Family Medicine

## 2019-09-22 MED ORDER — AMPHETAMINE-DEXTROAMPHET ER 20 MG PO CP24: 20.0000 mg | ORAL_CAPSULE | ORAL | 0 refills | Status: DC

## 2019-09-22 MED ORDER — AMPHETAMINE-DEXTROAMPHET ER 20 MG PO CP24: 20.0000 mg | ORAL_CAPSULE | Freq: Every day | ORAL | 0 refills | Status: DC

## 2019-09-22 NOTE — Telephone Encounter (Signed)
  LAST APPOINTMENT DATE: 06/20/2019   NEXT APPOINTMENT DATE:@Visit  date not found  MEDICATION:amphetamine-dextroamphetamine (ADDERALL XR) 20 MG 24 hr capsule amphetamine-dextroamphetamine (ADDERALL) 10 MG tablet  PHARMACY: [facility]  [address] [place], [place], [place] [address] [phone]  **Let patient know to contact pharmacy at the end of the day to make sure medication is ready. **  ** Please notify patient to allow 48-72 hours to process**  **Encourage patient to contact the pharmacy for refills or they can request refills through [facility]**  CLINICAL FILLS OUT ALL BELOW:   LAST REFILL:  QTY:  REFILL DATE:    OTHER COMMENTS:    Okay for refill?  Please advise

## 2019-09-24 NOTE — Telephone Encounter (Signed)
Patient called in saying that the prescription was sent to [facility] instead of [facility] and asked to have this switched,

## 2019-09-24 NOTE — Addendum Note (Signed)
Addended by: Lieutenant Diego A on: 09/24/2019 03:38 PM   Modules accepted: Orders

## 2019-09-25 MED ORDER — AMPHETAMINE-DEXTROAMPHET ER 20 MG PO CP24: 20.0000 mg | ORAL_CAPSULE | Freq: Every day | ORAL | 0 refills | Status: DC

## 2019-09-25 MED ORDER — AMPHETAMINE-DEXTROAMPHET ER 20 MG PO CP24: 20.0000 mg | ORAL_CAPSULE | ORAL | 0 refills | Status: DC

## 2019-09-25 NOTE — Addendum Note (Signed)
Addended by: Shelva Majestic on: 09/25/2019 09:00 PM   Modules accepted: Orders

## 2019-10-06 NOTE — Telephone Encounter (Signed)
Pt called stating she called [facility] and her medications are still not there. Please advise.

## 2019-10-13 ENCOUNTER — Other Ambulatory Visit: Payer: Self-pay

## 2019-10-13 ENCOUNTER — Other Ambulatory Visit: Payer: Self-pay | Admitting: Family Medicine

## 2019-10-13 MED ORDER — AMPHETAMINE-DEXTROAMPHETAMINE 10 MG PO TABS: ORAL_TABLET | ORAL | 0 refills | Status: DC

## 2019-10-13 MED ORDER — AMPHETAMINE-DEXTROAMPHETAMINE 10 MG PO TABS: 10.0000 mg | ORAL_TABLET | Freq: Every day | ORAL | 0 refills | Status: DC | PRN

## 2019-10-13 NOTE — Progress Notes (Signed)
Super helpful thanks-I sent in the same

## 2019-10-13 NOTE — Progress Notes (Signed)
So she is in extended release and instant release at the same time.  Can you please reload the 10 mg instant release with the following signature"     Sig: Take 1 tablet (10 mg total) by mouth daily as needed (at least 8 hours after extended release dose).  "  I try to lower this up but it keeps reverting back to take 1 tablet by mouth twice daily of the 10 mg and I wanted to have a signature as above only

## 2019-10-13 NOTE — Telephone Encounter (Signed)
Patient calling back in and stated that her medication has still not been received at [facility]. Please advise.    amphetamine-dextroamphetamine (ADDERALL XR) 20 MG 24 hr capsule amphetamine-dextroamphetamine (ADDERALL) 10 MG tablet  PHARMACY: [facility]  [address] [place], [place], [place] [address] [phone]

## 2019-10-13 NOTE — Progress Notes (Signed)
Done

## 2019-10-14 ENCOUNTER — Other Ambulatory Visit: Payer: Self-pay

## 2019-10-14 MED ORDER — AMPHETAMINE-DEXTROAMPHET ER 20 MG PO CP24: 20.0000 mg | ORAL_CAPSULE | ORAL | 0 refills | Status: DC

## 2019-10-14 MED ORDER — AMPHETAMINE-DEXTROAMPHET ER 20 MG PO CP24: 20.0000 mg | ORAL_CAPSULE | Freq: Every day | ORAL | 0 refills | Status: DC

## 2019-10-14 NOTE — Progress Notes (Signed)
This is what was said about last rx  " E-Prescribing Status: Transmission to pharmacy failed (09/25/2019  9:00 PM EST)   Message completed by Lorenza Evangelist, CMA (09/29/2019 9:31 AM).   "  Please make sure this one doesn't fail again- if it does reach out to [facility] to see if they have any tips

## 2019-10-15 NOTE — Progress Notes (Signed)
Noted  

## 2019-10-21 NOTE — Telephone Encounter (Signed)
Patient called in and states that the extended release-amphetamine-dextroamphetamine (ADDERALL XR) 20 MG 24 hr capsule was not sent to [facility] and asked if it could be sent.

## 2019-10-21 NOTE — Telephone Encounter (Signed)
Adderall 20mg  was sent in on 10/14/19. Called and spoke with pharmacist and this Is requiring a PA, PA submitted for pt.

## 2019-10-27 ENCOUNTER — Telehealth: Payer: Self-pay

## 2019-10-27 NOTE — Telephone Encounter (Signed)
Jenkins Rouge KeyLatricia Heft - PA Case ID: [id number] - Rx #: [id number] Need help? Call us at [phone] Status Sent to [facility] Amphetamine-Dextroamphet ER 20MG  er capsules Form [employer] Electronic PA Form (NCPDP) Original Claim Info 75 PRIOR AUTH REQ-MD CALL [phone]. DRUG REQUIRES PRIOR AUTHORIZATION

## 2019-10-28 NOTE — Telephone Encounter (Signed)
Approved from 10/27/2019 to 10/27/2022

## 2019-11-12 ENCOUNTER — Encounter: Payer: Self-pay | Admitting: Family Medicine

## 2019-11-21 ENCOUNTER — Ambulatory Visit: Payer: BC Managed Care – PPO | Attending: Internal Medicine

## 2019-11-21 DIAGNOSIS — Z23 Encounter for immunization: Secondary | ICD-10-CM

## 2019-11-21 NOTE — Progress Notes (Signed)
   Covid-19 Vaccination Clinic  Name:  Laura King    MRN: [id number] DOB: Dec 26, 1990  11/21/2019  [name] was observed post Covid-19 immunization for 15 minutes without incident. She was provided with Vaccine Information Sheet and instruction to access the V-Safe system.   [name] was instructed to call 911 with any severe reactions post vaccine: Marland Kitchen Difficulty breathing  . Swelling of face and throat  . A fast heartbeat  . A bad rash all over body  . Dizziness and weakness   Immunizations Administered    Name Date Dose VIS Date Route   Pfizer COVID-19 Vaccine 11/21/2019  1:14 PM 0.3 mL 08/01/2019 Intramuscular   Manufacturer: [employer], [employer]   Lot: [id number]   NDC: [address]-2      Covid-19 Vaccination Clinic  Name:  Laura King    MRN: [id number] DOB: [birth year]  11/21/2019  [name] was observed post Covid-19 immunization for 15 minutes without incident. She was provided with Vaccine Information Sheet and instruction to access the V-Safe system.   [name] was instructed to call 911 with any severe reactions post vaccine: Marland Kitchen Difficulty breathing  . Swelling of face and throat  . A fast heartbeat  . A bad rash all over body  . Dizziness and weakness   Immunizations Administered    Name Date Dose VIS Date Route   Pfizer COVID-19 Vaccine 11/21/2019  1:14 PM 0.3 mL 08/01/2019 Intramuscular   Manufacturer: [employer], [employer]   Lot: [id number]   NDC: [address]-1

## 2019-12-15 ENCOUNTER — Ambulatory Visit: Payer: BC Managed Care – PPO | Attending: Internal Medicine

## 2019-12-15 DIAGNOSIS — Z23 Encounter for immunization: Secondary | ICD-10-CM

## 2019-12-15 NOTE — Progress Notes (Signed)
   Covid-19 Vaccination Clinic  Name:  Tomara Youngberg    MRN: [id number] DOB: [birth year]  12/15/2019  Ms. Chowning was observed post Covid-19 immunization for 15 minutes without incident. She was provided with Vaccine Information Sheet and instruction to access the V-Safe system.   Ms. Petrak was instructed to call 911 with any severe reactions post vaccine: Marland Kitchen Difficulty breathing  . Swelling of face and throat  . A fast heartbeat  . A bad rash all over body  . Dizziness and weakness   Immunizations Administered    Name Date Dose VIS Date Route   Pfizer COVID-19 Vaccine 12/15/2019  3:34 PM 0.3 mL 10/15/2018 Intramuscular   Manufacturer: [employer], [employer]   Lot: [id number]   NDC: [address]-3

## 2019-12-16 ENCOUNTER — Ambulatory Visit: Payer: BC Managed Care – PPO

## 2020-02-18 ENCOUNTER — Other Ambulatory Visit: Payer: Self-pay | Admitting: Family Medicine

## 2020-02-18 MED ORDER — AMPHETAMINE-DEXTROAMPHETAMINE 10 MG PO TABS: ORAL_TABLET | ORAL | 0 refills | Status: DC

## 2020-02-18 MED ORDER — AMPHETAMINE-DEXTROAMPHET ER 20 MG PO CP24: 20.0000 mg | ORAL_CAPSULE | ORAL | 0 refills | Status: DC

## 2020-02-18 NOTE — Telephone Encounter (Signed)
..   LAST APPOINTMENT DATE: 10/27/2019   NEXT APPOINTMENT DATE:@7 /29/2021  MEDICATION:amphetamine-dextroamphetamine (ADDERALL XR) 20 MG 24 hr  capsule  amphetamine-dextroamphetamine (ADDERALL) 10 MG tablet   [id number] - , [place] - [address]  **Let patient know to contact pharmacy at the end of the day to make sure medication is ready. **  ** Please notify patient to allow 48-72 hours to process**  **Encourage patient to contact the pharmacy for refills or they can request refills through [facility]**  CLINICAL FILLS OUT ALL BELOW:   LAST REFILL:  QTY:  REFILL DATE:    OTHER COMMENTS:    Okay for refill?  Please advise

## 2020-03-16 NOTE — Patient Instructions (Addendum)
Congratulations on the change in jobs!! [employer] that it give you more time for your art.   ADHD : Glad that your medication are working well for you. With your new change in schedule we are going to increase your afternoon dose to 5 times a week. If you have any questions let our office know.   We will do labs at next appointment for Physical.

## 2020-03-16 NOTE — Progress Notes (Signed)
Phone [phone] In person visit   Subjective:   Laura King is a 29 y.o. year old very pleasant female patient who presents for/with See problem oriented charting Chief Complaint  Patient presents with  . ADD    Tolerating medication well     This visit occurred during the SARS-CoV-2 public health emergency.  Safety protocols were in place, including screening questions prior to the visit, additional usage of staff PPE, and extensive cleaning of exam room while observing appropriate contact time as indicated for disinfecting solutions.   Past Medical History-  Patient Active Problem List   Diagnosis Date Noted  . Attention deficit disorder (ADD) in adult     Priority: Medium  . Irregular periods/menstrual cycles 11/16/2014    Priority: Low  . Family history of breast cancer 11/16/2014    Priority: Low  . Status post laparoscopy 03/29/2017    Medications- reviewed and updated Current Outpatient Medications  Medication Sig Dispense Refill  . amphetamine-dextroamphetamine (ADDERALL) 10 MG tablet Take 1 tablet (10 mg total) by mouth daily as needed (at least 8 hours after extended release dose 20 tablet 0  . amphetamine-dextroamphetamine (ADDERALL) 10 MG tablet Take 1 tablet (10 mg total) by mouth daily as needed (at least 8 hours after extended release dose 20 tablet 0  . etonogestrel (IMPLANON) 68 MG IMPL implant 1 each by Subdermal route once.    Marland Kitchen amphetamine-dextroamphetamine (ADDERALL XR) 20 MG 24 hr capsule Take 1 capsule (20 mg total) by mouth every morning. 30 capsule 0  . [START ON 04/17/2020] amphetamine-dextroamphetamine (ADDERALL XR) 20 MG 24 hr capsule Take 1 capsule (20 mg total) by mouth every morning. 30 capsule 0  . [START ON 05/17/2020] amphetamine-dextroamphetamine (ADDERALL XR) 20 MG 24 hr capsule Take 1 capsule (20 mg total) by mouth every morning. 30 capsule 0  . amphetamine-dextroamphetamine (ADDERALL) 10 MG tablet Take 1 tablet (10 mg total) by mouth daily  as needed (at least 8 hours after extended release dose). 20 tablet 0  . amphetamine-dextroamphetamine (ADDERALL) 10 MG tablet 8 hours after the morning dose.Up to 4 days a week 24 tablet 0  . [START ON 04/17/2020] amphetamine-dextroamphetamine (ADDERALL) 10 MG tablet 8 hours after the morning dose. Up to 4 days a week 24 tablet 0  . [START ON 05/17/2020] amphetamine-dextroamphetamine (ADDERALL) 10 MG tablet 8 hours after the morning dose. Up to 4 days a week 24 tablet 0  . SPRIX 15.75 MG/SPRAY SOLN PLACE 1 SPRAY INTO THE NOSE EVERY 6 HOURS. (Patient not taking: Reported on 06/20/2019) 5 each 0   No current facility-administered medications for this visit.     Objective:  BP (!) 118/60   Pulse 57   Temp 97.7 F (36.5 C) (Temporal)   Resp 18   Ht 5\' 2"  (1.575 m)   Wt 149 lb (67.6 kg)   SpO2 98%   BMI 27.25 kg/m  Gen: NAD, resting comfortably CV: RRR no murmurs rubs or gallops Lungs: CTAB no crackles, wheeze, rhonchi Ext: no edema Skin: warm, dry    Assessment and Plan   # Adult ADD S: Current medications:Adderall xr 20mg  daily and Adderall 10mg  instant release on as-needed basis for extended work days.  Has been tolerating her medication well. 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  She is now doing 5 days a week-previously had been working 4 days a week so on the fifth day  she could sculpt in the daytime.  She continues to sculpt in the evenings after work but is having some difficulty on the fifth day of the week after work since current prescription for instant release is only 4 days a week -Original diagnosis May 12, 2008 at [facility] Dr. Jonah Blue  Denies appetite changes, unintentional weight loss, difficulty with sleep, anxiety, palpitations or fainting, suicidal thoughts, changes in mental health, other new medications, illicit substance abuse. On  implanon for birth control.   A/P: ADD in adult-appears stable/doing well on most days but with new work schedule is poorly controlled on 1 day a week for her sculpting.  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 -Refill medications today -Can refill medications in 3 months via MyChart or phone call and then see each other in person in 6 months -Due for UDS today. We discussed updating labs but she is concerned about current insurance- will line this up with CPE in 6 months -Low risk refill patternsper PDMP/state database aware  Recommended follow up:  6 months physical   Lab/Order associations:   ICD-10-CM   1. Attention deficit disorder (ADD) in adult  F98.8     Meds ordered this encounter  Medications  . amphetamine-dextroamphetamine (ADDERALL) 10 MG tablet    Sig: 8 hours after the morning dose.Up to 5 days a week    Dispense:  24 tablet    Refill:  0  . amphetamine-dextroamphetamine (ADDERALL) 10 MG tablet    Sig: 8 hours after the morning dose. Up to 5 days a week    Dispense:  24 tablet    Refill:  0  . amphetamine-dextroamphetamine (ADDERALL) 10 MG tablet    Sig: 8 hours after the morning dose. Up to 5 days a week    Dispense:  24 tablet    Refill:  0  . amphetamine-dextroamphetamine (ADDERALL XR) 20 MG 24 hr capsule    Sig: Take 1 capsule (20 mg total) by mouth every morning.    Dispense:  30 capsule    Refill:  0  . amphetamine-dextroamphetamine (ADDERALL XR) 20 MG 24 hr capsule    Sig: Take 1 capsule (20 mg total) by mouth every morning.    Dispense:  30 capsule    Refill:  0  . amphetamine-dextroamphetamine (ADDERALL XR) 20 MG 24 hr capsule    Sig: Take 1 capsule (20 mg total) by mouth every morning.    Dispense:  30 capsule    Refill:  0    Return precautions advised.  Tana Conch, MD

## 2020-03-18 ENCOUNTER — Other Ambulatory Visit: Payer: Self-pay

## 2020-03-18 ENCOUNTER — Encounter: Payer: Self-pay | Admitting: Family Medicine

## 2020-03-18 ENCOUNTER — Ambulatory Visit (INDEPENDENT_AMBULATORY_CARE_PROVIDER_SITE_OTHER): Payer: BC Managed Care – PPO | Admitting: Family Medicine

## 2020-03-18 VITALS — BP 118/60 | HR 57 | Temp 97.7°F | Resp 18 | Ht 62.0 in | Wt 149.0 lb

## 2020-03-18 DIAGNOSIS — Z0283 Encounter for blood-alcohol and blood-drug test: Secondary | ICD-10-CM

## 2020-03-18 DIAGNOSIS — F988 Other specified behavioral and emotional disorders with onset usually occurring in childhood and adolescence: Secondary | ICD-10-CM

## 2020-03-18 MED ORDER — AMPHETAMINE-DEXTROAMPHETAMINE 10 MG PO TABS
ORAL_TABLET | ORAL | 0 refills | Status: DC
Start: 2020-05-17 — End: 2020-12-23

## 2020-03-18 MED ORDER — AMPHETAMINE-DEXTROAMPHET ER 20 MG PO CP24
20.0000 mg | ORAL_CAPSULE | ORAL | 0 refills | Status: DC
Start: 2020-05-17 — End: 2020-12-23

## 2020-03-18 MED ORDER — AMPHETAMINE-DEXTROAMPHETAMINE 10 MG PO TABS: ORAL_TABLET | ORAL | 0 refills | Status: DC

## 2020-03-18 MED ORDER — AMPHETAMINE-DEXTROAMPHET ER 20 MG PO CP24
20.0000 mg | ORAL_CAPSULE | ORAL | 0 refills | Status: DC
Start: 2020-03-18 — End: 2020-08-25

## 2020-03-18 MED ORDER — AMPHETAMINE-DEXTROAMPHET ER 20 MG PO CP24
20.0000 mg | ORAL_CAPSULE | ORAL | 0 refills | Status: DC
Start: 2020-04-17 — End: 2020-12-23

## 2020-03-20 LAB — DRUG MONITORING, PANEL 8 WITH CONFIRMATION, URINE
6 Acetylmorphine: NEGATIVE ng/mL (ref <10)
Alcohol Metabolites: POSITIVE ng/mL — AB
Amphetamine: 2668 ng/mL — ABNORMAL HIGH (ref <250)
Amphetamines: POSITIVE ng/mL — AB (ref <500)
Benzodiazepines: NEGATIVE ng/mL (ref <100)
Buprenorphine, Urine: NEGATIVE ng/mL (ref <5)
Cocaine Metabolite: NEGATIVE ng/mL (ref <150)
Creatinine: 122.2 mg/dL
Ethyl Glucuronide (ETG): 6685 ng/mL — ABNORMAL HIGH (ref <500)
Ethyl Sulfate (ETS): 1909 ng/mL — ABNORMAL HIGH (ref <100)
MDMA: NEGATIVE ng/mL (ref <500)
Marijuana Metabolite: NEGATIVE ng/mL (ref <20)
Methamphetamine: NEGATIVE ng/mL (ref <250)
Opiates: NEGATIVE ng/mL (ref <100)
Oxidant: NEGATIVE ug/mL
Oxycodone: NEGATIVE ng/mL (ref <100)
pH: 6.9 (ref 4.5–9.0)

## 2020-03-20 LAB — DM TEMPLATE

## 2020-07-23 ENCOUNTER — Ambulatory Visit: Payer: BC Managed Care – PPO | Attending: Internal Medicine

## 2020-07-23 DIAGNOSIS — Z23 Encounter for immunization: Secondary | ICD-10-CM

## 2020-07-23 NOTE — Progress Notes (Signed)
   Covid-19 Vaccination Clinic  Name:  Angellee Cohill    MRN: [id number] DOB: Aug 04, 1991  07/23/2020  Ms. Cronin was observed post Covid-19 immunization for 15 minutes without incident. She was provided with Vaccine Information Sheet and instruction to access the V-Safe system.   Ms. Rossie was instructed to call 911 with any severe reactions post vaccine: Marland Kitchen Difficulty breathing  . Swelling of face and throat  . A fast heartbeat  . A bad rash all over body  . Dizziness and weakness   Immunizations Administered    No immunizations on file.

## 2020-08-25 ENCOUNTER — Telehealth: Payer: Self-pay

## 2020-08-25 MED ORDER — AMPHETAMINE-DEXTROAMPHET ER 20 MG PO CP24: 20.0000 mg | ORAL_CAPSULE | ORAL | 0 refills | Status: DC

## 2020-08-25 MED ORDER — AMPHETAMINE-DEXTROAMPHETAMINE 10 MG PO TABS: ORAL_TABLET | ORAL | 0 refills | Status: DC

## 2020-08-25 NOTE — Telephone Encounter (Signed)
Pharmacy called regarding amphetamine-dextroamphetamine (ADDERALL) 10 MG tablet  Please call back [phone]

## 2020-08-25 NOTE — Telephone Encounter (Signed)
..   LAST APPOINTMENT DATE: 03/18/2020   NEXT APPOINTMENT DATE:@1 /31/2022  MEDICATION:amphetamine-dextroamphetamine (ADDERALL) 10 MG tablet    PHARMACY:[id number] - [place], [place] - [address]

## 2020-08-25 NOTE — Addendum Note (Signed)
Addended by: Shelva Majestic on: 08/25/2020 12:44 PM   Modules accepted: Orders

## 2020-08-25 NOTE — Telephone Encounter (Signed)
See below

## 2020-08-26 NOTE — Telephone Encounter (Signed)
Called and spoke with pharmacy and the signature is not clear on the ADDERALL 10Mg , they need clarification on how many tablets pt is to take.

## 2020-08-26 NOTE — Telephone Encounter (Signed)
She takes the 20 mg extended release tablet in the morning.  8 hours after she takes this she takes an instant release if she has more work to do that day

## 2020-08-30 NOTE — Telephone Encounter (Signed)
Called [facility] and clarified instructions with the pharmacist.

## 2020-09-20 ENCOUNTER — Encounter: Payer: BC Managed Care – PPO | Admitting: Family Medicine

## 2020-09-27 ENCOUNTER — Other Ambulatory Visit: Payer: Self-pay | Admitting: Family Medicine

## 2020-09-28 NOTE — Telephone Encounter (Signed)
Adderall XR 20 mg last rx 08/25/20 #30 Adderall 10 mg last rx 08/25/20 #24 LOV: 03/18/20 ADD NOV: 12/24/20 CPE/ADD

## 2020-11-08 ENCOUNTER — Other Ambulatory Visit: Payer: Self-pay | Admitting: Family Medicine

## 2020-12-23 NOTE — Progress Notes (Signed)
Phone [phone]   Subjective:  Patient presents today for their annual physical. Chief complaint-noted.   See problem oriented charting- ROS- full  review of systems was completed and negative except for: brain fog, fatigue, seasonal allergies, headaches, had presyncopal  During prolonged game 5 hours in- not particularly physical- stress level was likely higher due to brain fog- was not menstruating- if recurs would follow up   The following were reviewed and entered/updated in epic: Past Medical History:  Diagnosis Date  . ADHD (attention deficit hyperactivity disorder)   . Arthritis    thumb  . Dysmenorrhea   . History of concussion    2011--  per pt no residual  . Menorrhagia    Patient Active Problem List   Diagnosis Date Noted  . Attention deficit disorder (ADD) in adult     Priority: Medium  . Irregular periods/menstrual cycles 11/16/2014    Priority: Low  . Family history of breast cancer 11/16/2014    Priority: Low  . Status post laparoscopy 03/29/2017   Past Surgical History:  Procedure Laterality Date  . LAPAROSCOPY N/A 03/29/2017   Procedure: LAPAROSCOPY DIAGNOSTIC, peritoneal biopsy;  Surgeon: Edwinna Areola, DO;  Location: [facility];  Service: Gynecology;  Laterality: N/A;  . WISDOM TOOTH EXTRACTION  2012    Family History  Problem Relation Age of Onset  . Breast cancer Mother        age 59, aunt in 45s, grandmother as well  . Alcohol abuse Father        father, now in [place]  . Endometriosis Sister   . Heart disease Other        paternal grandfather  . Hypertension Other        dad's family  . Depression Sister   . Bipolar disorder Sister        and eating disorder    Medications- reviewed and updated Current Outpatient Medications  Medication Sig Dispense Refill  . ADDERALL XR 20 MG 24 hr capsule TAKE ONE CAPSULE BY MOUTH DAILY IN THE MORNING 30 capsule 0  . amphetamine-dextroamphetamine (ADDERALL XR) 20 MG 24 hr capsule  Take 1 capsule (20 mg total) by mouth every morning. 30 capsule 0  . amphetamine-dextroamphetamine (ADDERALL XR) 20 MG 24 hr capsule Take 1 capsule (20 mg total) by mouth every morning. 30 capsule 0  . amphetamine-dextroamphetamine (ADDERALL) 10 MG tablet 8 hours after the morning dose. Up to 5 days a week 24 tablet 0  . amphetamine-dextroamphetamine (ADDERALL) 10 MG tablet 8 hours after the morning dose. Up to 5 days a week 24 tablet 0  . amphetamine-dextroamphetamine (ADDERALL) 10 MG tablet TAKE 1 TABLET BY MOUTH 8 HOURS AFTER THE MORNING DOSE UP TO 5 TIMES A WEEK 24 tablet 0  . etonogestrel (NEXPLANON) 68 MG IMPL implant 1 each by Subdermal route once.    . SPRIX 15.75 MG/SPRAY SOLN PLACE 1 SPRAY INTO THE NOSE EVERY 6 HOURS. (Patient not taking: No sig reported) 5 each 0   No current facility-administered medications for this visit.    Allergies-reviewed and updated No Known Allergies  Social History   Social History Narrative   Single. Went to college in [place], [place] and had primary care doctor there. At [employer] grad school (final year)-may or may not be in [place] next year.       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       Hobbies:  yugioh, make art, rides horses   Objective  Objective:  BP 105/69   Pulse 62   Temp 97.6 F (36.4 C) (Temporal)   Ht 5\' 2"  (1.575 m)   Wt 149 lb 9.6 oz (67.9 kg)   LMP 12/23/2020   SpO2 99%   BMI 27.36 kg/m  Gen: NAD, resting comfortably HEENT: Mucous membranes are moist. Oropharynx normal Neck: no thyromegaly CV: RRR no murmurs rubs or gallops Lungs: CTAB no crackles, wheeze, rhonchi Abdomen: soft/nontender/nondistended/normal bowel sounds. No rebound or guarding.  Ext: no edema Skin: warm, dry Neuro: grossly normal, moves all extremities, PERRLA   Assessment and Plan   30 y.o. female presenting for annual physical.  Health Maintenance counseling: 1. Anticipatory guidance: Patient counseled regarding  regular dental exams - plans to schedule- is overdo, eye exams - encouraged to schedule updated eye exam,  avoiding smoking and second hand smoke , limiting alcohol to 1 beverage per day- under this and should not have within 8 hours of ADD meds .   2. Risk factor reduction:  Advised patient of need for regular exercise and diet rich and fruits and vegetables to reduce risk of heart attack and stroke. Exercise- twice a week biking. Diet- discussed healthy choices.  Wt Readings from Last 3 Encounters:  12/24/20 149 lb 9.6 oz (67.9 kg)  03/18/20 149 lb (67.6 kg)  06/20/19 153 lb (69.4 kg)   3. Immunizations/screenings/ancillary studies- upt od ate. Screen hcv Immunization History  Administered Date(s) Administered  . Moderna SARS-COV2 Booster Vaccination 07/23/2020  . PFIZER(Purple Top)SARS-COV-2 Vaccination 11/21/2019, 12/15/2019  . Tdap 10/19/2014  4. Cervical cancer screening- 07/12/18- plans to schedule repeat 5. Breast cancer screening-  breast exam with GYn and mammogram - family history breast cancer- will discuss mammogram with GYn 6. Colon cancer screening - no family history, start at age 27 7. Skin cancer screening- no dermatologist. advised regular sunscreen use. Denies worrisome, changing, or new skin lesions.  8. Birth control/STD check- nexplanon in place. Denies unprotected sex 6. Osteoporosis screening at 51- will plan on this later -Never smoker  Status of chronic or acute concerns   # Post covid symptoms S:patient believes had flu and then shortly after contracted covid in September of 2021- lingering brain fog, fatigue.    Biking twice a week- 20 minutes of hard exercise or up to 1.5 hours. She did get a booster but did not fele like that helped her.  A/P: post covid symptoms- discussed continued efforts for healthy/exercise but symptoms can still persist. Could try vitamin D and b12 over the counter.    # screening hyperlipidemia S: Medication: none  Lab Results   Component Value Date   LDLDIRECT 81.0 11/16/2014   A/P: has been several years since checked- update today  # ADD S:with brain fog difficult to fully assess if controlled- symptoms certainly worse if doesn't take meds. Doing 20 mg daily and then up to 5 days a week when sculpting or having to do prolonged desk work- takes additional 10 mg dose.   A/P: difficult to fully assess control- will continue current meds for now - <1 year since drug screen  - PDMP reviewed- low risk trend -original diagnosis 05/12/08 at [employer] Dr. 05/14/08 [facility] -controlled substance contract 04/07/16  Recommended follow up: No follow-ups on file.  Lab/Order associations:NOT fasting   ICD-10-CM   1. Preventative health care  Z00.00   2. Attention deficit disorder (ADD) in adult  F98.8   3. Encounter for hepatitis  C screening test for low risk patient  Z11.59   4. Screening for hyperlipidemia  Z13.220     No orders of the defined types were placed in this encounter.   Return precautions advised.  Tana Conch, MD

## 2020-12-23 NOTE — Patient Instructions (Addendum)
Please stop by lab before you go If you have mychart- we will send your results within 3 business days of [place] receiving them.  If you do not have mychart- we will call you about results within 5 business days of [place] receiving them.  *please also note that you will see labs on mychart as soon as they post. I will later go in and write notes on them- will say "notes from Dr. Durene Cal"  No changes today unless labs lead [place] to make changes  Recommended follow up: Return in about 6 months (around 06/26/2021) for follow up- or sooner if needed.

## 2020-12-24 ENCOUNTER — Other Ambulatory Visit: Payer: Self-pay

## 2020-12-24 ENCOUNTER — Encounter: Payer: Self-pay | Admitting: Family Medicine

## 2020-12-24 ENCOUNTER — Ambulatory Visit (INDEPENDENT_AMBULATORY_CARE_PROVIDER_SITE_OTHER): Payer: 59 | Admitting: Family Medicine

## 2020-12-24 VITALS — BP 105/69 | HR 62 | Temp 97.6°F | Ht 62.0 in | Wt 149.6 lb

## 2020-12-24 DIAGNOSIS — Z0000 Encounter for general adult medical examination without abnormal findings: Secondary | ICD-10-CM | POA: Diagnosis not present

## 2020-12-24 DIAGNOSIS — Z1159 Encounter for screening for other viral diseases: Secondary | ICD-10-CM | POA: Diagnosis not present

## 2020-12-24 DIAGNOSIS — F988 Other specified behavioral and emotional disorders with onset usually occurring in childhood and adolescence: Secondary | ICD-10-CM

## 2020-12-24 DIAGNOSIS — Z13220 Encounter for screening for lipoid disorders: Secondary | ICD-10-CM

## 2020-12-24 LAB — CBC WITH DIFFERENTIAL/PLATELET
Basophils Absolute: 0 10*3/uL (ref 0.0–0.1)
Basophils Relative: 0.6 % (ref 0.0–3.0)
Eosinophils Absolute: 0.3 10*3/uL (ref 0.0–0.7)
Eosinophils Relative: 5.2 % — ABNORMAL HIGH (ref 0.0–5.0)
HCT: 41 % (ref 36.0–46.0)
Hemoglobin: 13.9 g/dL (ref 12.0–15.0)
Lymphocytes Relative: 27.4 % (ref 12.0–46.0)
Lymphs Abs: 1.4 10*3/uL (ref 0.7–4.0)
MCHC: 33.9 g/dL (ref 30.0–36.0)
MCV: 89.4 fl (ref 78.0–100.0)
Monocytes Absolute: 0.4 10*3/uL (ref 0.1–1.0)
Monocytes Relative: 8.3 % (ref 3.0–12.0)
Neutro Abs: 2.9 10*3/uL (ref 1.4–7.7)
Neutrophils Relative %: 58.5 % (ref 43.0–77.0)
Platelets: 257 10*3/uL (ref 150.0–400.0)
RBC: 4.58 Mil/uL (ref 3.87–5.11)
RDW: 12.8 % (ref 11.5–15.5)
WBC: 5 10*3/uL (ref 4.0–10.5)

## 2020-12-24 LAB — COMPREHENSIVE METABOLIC PANEL
ALT: 11 U/L (ref 0–35)
AST: 12 U/L (ref 0–37)
Albumin: 4.3 g/dL (ref 3.5–5.2)
Alkaline Phosphatase: 37 U/L — ABNORMAL LOW (ref 39–117)
BUN: 10 mg/dL (ref 6–23)
CO2: 28 mEq/L (ref 19–32)
Calcium: 9.4 mg/dL (ref 8.4–10.5)
Chloride: 104 mEq/L (ref 96–112)
Creatinine, Ser: 0.69 mg/dL (ref 0.40–1.20)
GFR: 115.47 mL/min (ref >60.00)
Glucose, Bld: 71 mg/dL (ref 70–99)
Potassium: 4.4 mEq/L (ref 3.5–5.1)
Sodium: 140 mEq/L (ref 135–145)
Total Bilirubin: 0.6 mg/dL (ref 0.2–1.2)
Total Protein: 7 g/dL (ref 6.0–8.3)

## 2020-12-24 LAB — LIPID PANEL
Cholesterol: 187 mg/dL (ref 0–200)
HDL: 72.1 mg/dL (ref >39.00)
LDL Cholesterol: 97 mg/dL (ref 0–99)
NonHDL: 114.84
Total CHOL/HDL Ratio: 3
Triglycerides: 88 mg/dL (ref 0.0–149.0)
VLDL: 17.6 mg/dL (ref 0.0–40.0)

## 2020-12-24 MED ORDER — AMPHETAMINE-DEXTROAMPHETAMINE 10 MG PO TABS: ORAL_TABLET | ORAL | 0 refills | Status: DC

## 2020-12-24 MED ORDER — AMPHETAMINE-DEXTROAMPHET ER 20 MG PO CP24: 20.0000 mg | ORAL_CAPSULE | ORAL | 0 refills | Status: DC

## 2020-12-27 LAB — HEPATITIS C ANTIBODY
Hepatitis C Ab: NONREACTIVE
SIGNAL TO CUT-OFF: 0 (ref <1.00)

## 2021-03-22 ENCOUNTER — Other Ambulatory Visit: Payer: Self-pay

## 2021-03-22 ENCOUNTER — Ambulatory Visit (INDEPENDENT_AMBULATORY_CARE_PROVIDER_SITE_OTHER): Payer: 59 | Admitting: Family Medicine

## 2021-03-22 ENCOUNTER — Encounter: Payer: Self-pay | Admitting: Family Medicine

## 2021-03-22 VITALS — BP 115/71 | HR 58 | Temp 97.4°F | Ht 62.0 in | Wt 145.4 lb

## 2021-03-22 DIAGNOSIS — R55 Syncope and collapse: Secondary | ICD-10-CM | POA: Diagnosis not present

## 2021-03-22 DIAGNOSIS — Z79899 Other long term (current) drug therapy: Secondary | ICD-10-CM | POA: Diagnosis not present

## 2021-03-22 NOTE — Patient Instructions (Addendum)
Health Maintenance Due  Topic Date Due   INFLUENZA VACCINE - let us know if you get this outside the office 03/21/2021   We will call you within two weeks about your referral to cardiology. If you do not hear within 2 weeks, give [place] a call.   We need to hold adderall until you see cardiology- please ask them specifically if they think it is safe for you to restart- this is not a permanent stop - but with unexplained syncope need to make sure they think it is safe. I can refill as needed once cleared  Unfortunately until cleared by cardiology  (and up to 6 months otherwise) you need to stop driving  Please stop by lab before you go If you have mychart- we will send your results within 3 business days of [place] receiving them.  If you do not have mychart- we will call you about results within 5 business days of [place] receiving them.  *please also note that you will see labs on mychart as soon as they post. I will later go in and write notes on them- will say "notes from Dr. Durene Cal"   Recommended follow up: keep November visit or sooner for new or worsening symptoms.
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
## 2021-03-23 ENCOUNTER — Other Ambulatory Visit: Payer: Self-pay

## 2021-03-23 DIAGNOSIS — F909 Attention-deficit hyperactivity disorder, unspecified type: Secondary | ICD-10-CM | POA: Insufficient documentation

## 2021-03-23 DIAGNOSIS — M1990 Unspecified osteoarthritis, unspecified site: Secondary | ICD-10-CM | POA: Insufficient documentation

## 2021-03-23 DIAGNOSIS — N946 Dysmenorrhea, unspecified: Secondary | ICD-10-CM | POA: Insufficient documentation

## 2021-03-23 DIAGNOSIS — Z87820 Personal history of traumatic brain injury: Secondary | ICD-10-CM | POA: Insufficient documentation

## 2021-03-23 DIAGNOSIS — N920 Excessive and frequent menstruation with regular cycle: Secondary | ICD-10-CM | POA: Insufficient documentation

## 2021-03-24 ENCOUNTER — Ambulatory Visit (INDEPENDENT_AMBULATORY_CARE_PROVIDER_SITE_OTHER): Payer: 59 | Admitting: Cardiology

## 2021-03-24 ENCOUNTER — Other Ambulatory Visit: Payer: Self-pay

## 2021-03-24 ENCOUNTER — Ambulatory Visit (INDEPENDENT_AMBULATORY_CARE_PROVIDER_SITE_OTHER): Payer: 59

## 2021-03-24 ENCOUNTER — Encounter: Payer: Self-pay | Admitting: Cardiology

## 2021-03-24 VITALS — BP 108/62 | HR 56 | Ht 62.0 in | Wt 146.8 lb

## 2021-03-24 DIAGNOSIS — R55 Syncope and collapse: Secondary | ICD-10-CM

## 2021-03-24 DIAGNOSIS — R011 Cardiac murmur, unspecified: Secondary | ICD-10-CM | POA: Diagnosis not present
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
## 2021-03-24 NOTE — Patient Instructions (Signed)
Medication Instructions:  Your physician recommends that you continue on your current medications as directed. Please refer to the Current Medication list given to you today.  *If you need a refill on your cardiac medications before your next appointment, please call your pharmacy*   Lab Work: None If you have labs (blood work) drawn today and your tests are completely normal, you will receive your results only by: MyChart Message (if you have MyChart) OR A paper copy in the mail If you have any lab test that is abnormal or we need to change your treatment, we will call you to review the results.   Testing/Procedures: Your physician has requested that you have an echocardiogram. Echocardiography is a painless test that uses sound waves to create images of your heart. It provides your doctor with information about the size and shape of your heart and how well your heart's chambers and valves are working. This procedure takes approximately one hour. There are no restrictions for this procedure.  Your physician has requested that you have a stress echocardiogram. For further information please visit [email or web address] Please follow instruction sheet as given.  A zio monitor was ordered today. It will remain on for 14 days. You will then return monitor and event diary in provided box. It takes 1-2 weeks for report to be downloaded and returned to [place]. We will call you with the results. If monitor falls off or has orange flashing light, please call Zio for further instructions.    Follow-Up: At [facility], you and your health needs are our priority.  As part of our continuing mission to provide you with exceptional heart care, we have created designated Provider Care Teams.  These Care Teams include your primary Cardiologist (physician) and Advanced Practice Providers (APPs -  Physician Assistants and Nurse Practitioners) who all work together to provide you with the care you need, when you  need it.  We recommend signing up for the patient portal called "MyChart".  Sign up information is provided on this After Visit Summary.  MyChart is used to connect with patients for Virtual Visits (Telemedicine).  Patients are able to view lab/test results, encounter notes, upcoming appointments, etc.  Non-urgent messages can be sent to your provider as well.   To learn more about what you can do with MyChart, go to [email or web address].    Your next appointment:   3 month(s)  The format for your next appointment:   In Person  Provider:   Belva Crome, MD   Other Instructions

## 2021-03-24 NOTE — Addendum Note (Signed)
Addended by: Delorse Limber I on: 03/24/2021 02:58 PM   Modules accepted: Orders

## 2021-03-26 LAB — DM TEMPLATE

## 2021-03-26 LAB — DRUG MONITORING, PANEL 8 WITH CONFIRMATION, URINE
6 Acetylmorphine: NEGATIVE ng/mL (ref <10)
Alcohol Metabolites: NEGATIVE ng/mL (ref <500)
Amphetamine: 1792 ng/mL — ABNORMAL HIGH (ref <250)
Amphetamines: POSITIVE ng/mL — AB (ref <500)
Benzodiazepines: NEGATIVE ng/mL (ref <100)
Buprenorphine, Urine: NEGATIVE ng/mL (ref <5)
Cocaine Metabolite: NEGATIVE ng/mL (ref <150)
Creatinine: 21.9 mg/dL (ref >=20.0)
MDMA: NEGATIVE ng/mL (ref <500)
Marijuana Metabolite: NEGATIVE ng/mL (ref <20)
Methamphetamine: NEGATIVE ng/mL (ref <250)
Opiates: NEGATIVE ng/mL (ref <100)
Oxidant: NEGATIVE ug/mL (ref <200)
Oxycodone: NEGATIVE ng/mL (ref <100)
pH: 7 (ref 4.5–9.0)

## 2021-03-31 NOTE — Addendum Note (Signed)
Addended by: Belva Crome R on: 03/31/2021 09:58 AM   Modules accepted: Orders

## 2021-04-01 ENCOUNTER — Ambulatory Visit (INDEPENDENT_AMBULATORY_CARE_PROVIDER_SITE_OTHER): Payer: 59

## 2021-04-01 ENCOUNTER — Other Ambulatory Visit: Payer: Self-pay

## 2021-04-01 ENCOUNTER — Telehealth: Payer: Self-pay

## 2021-04-01 DIAGNOSIS — R55 Syncope and collapse: Secondary | ICD-10-CM

## 2021-04-01 DIAGNOSIS — R011 Cardiac murmur, unspecified: Secondary | ICD-10-CM | POA: Diagnosis not present

## 2021-04-01 LAB — ECHOCARDIOGRAM COMPLETE: Area-P 1/2: 3.83 cm2

## 2021-04-01 NOTE — Telephone Encounter (Signed)
Spoke with pt with following details: Scheduled for 04/07/21 Arrive at the outpatient center at 7:30 for 8:00 stress echo. NPO after midnight Dress prepared to exercise.   [facility] [address] [place], [place] [address] [phone]  Pt verbalized understanding and had no additional questions.

## 2021-04-07 ENCOUNTER — Encounter: Payer: Self-pay | Admitting: Cardiology

## 2021-04-07 DIAGNOSIS — R55 Syncope and collapse: Secondary | ICD-10-CM

## 2021-04-08 ENCOUNTER — Telehealth: Payer: Self-pay

## 2021-04-08 NOTE — Telephone Encounter (Signed)
Stress echo normal  Results reviewed with pt as per Dr. Kem Parkinson note.  Pt verbalized understanding and had no additional questions.

## 2021-04-27 ENCOUNTER — Other Ambulatory Visit: Payer: Self-pay | Admitting: Family Medicine

## 2021-04-27 NOTE — Telephone Encounter (Signed)
Appears 10 mg is loaded as XR- not correct- see below from last note and reload for me please    ADDERALL XR 20 MG 24 hr capsule TAKE ONE CAPSULE BY MOUTH DAILY IN THE MORNING 30 capsule 0   amphetamine-dextroamphetamine (ADDERALL) 10 MG tablet 8 hours after the morning dose. Up to 5 days a week 24 tablet

## 2021-04-27 NOTE — Telephone Encounter (Signed)
Sig is still not the same as previous- is loaded as IR now- but need to add 5 days a week and # should be 24 as per prior rx

## 2021-05-01 MED ORDER — AMPHETAMINE-DEXTROAMPHETAMINE 10 MG PO TABS: ORAL_TABLET | ORAL | 0 refills | Status: DC

## 2021-05-01 MED ORDER — AMPHETAMINE-DEXTROAMPHET ER 20 MG PO CP24: 20.0000 mg | ORAL_CAPSULE | Freq: Every day | ORAL | 0 refills | Status: DC

## 2021-05-01 NOTE — Telephone Encounter (Signed)
Great job on this [place]. Please let patient know if has any recurrent passing out spells we will likely need to discontinue the add meds- I am glad cardiac workup has been reassuring

## 2021-06-16 ENCOUNTER — Other Ambulatory Visit: Payer: Self-pay | Admitting: Family Medicine

## 2021-06-16 MED ORDER — AMPHETAMINE-DEXTROAMPHET ER 20 MG PO CP24: 20.0000 mg | ORAL_CAPSULE | Freq: Every day | ORAL | 0 refills | Status: DC

## 2021-06-16 MED ORDER — AMPHETAMINE-DEXTROAMPHETAMINE 10 MG PO TABS: ORAL_TABLET | ORAL | 0 refills | Status: DC

## 2021-06-16 NOTE — Telephone Encounter (Signed)
Pt requesting refill Adderall. She is scheduled for an office visit 06/27/2021 with you.
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
## 2021-06-27 ENCOUNTER — Ambulatory Visit: Payer: 59 | Admitting: Family Medicine

## 2021-06-27 DIAGNOSIS — Z23 Encounter for immunization: Secondary | ICD-10-CM

## 2021-06-27 DIAGNOSIS — F988 Other specified behavioral and emotional disorders with onset usually occurring in childhood and adolescence: Secondary | ICD-10-CM

## 2021-06-30 ENCOUNTER — Ambulatory Visit: Payer: 59 | Admitting: Cardiology

## 2021-07-19 ENCOUNTER — Telehealth: Payer: Self-pay

## 2021-07-19 ENCOUNTER — Other Ambulatory Visit: Payer: Self-pay | Admitting: Family Medicine

## 2021-07-19 MED ORDER — AMPHETAMINE-DEXTROAMPHET ER 20 MG PO CP24: 20.0000 mg | ORAL_CAPSULE | Freq: Every day | ORAL | 0 refills | Status: DC

## 2021-07-19 MED ORDER — AMPHETAMINE-DEXTROAMPHETAMINE 10 MG PO TABS: ORAL_TABLET | ORAL | 0 refills | Status: DC

## 2021-07-19 NOTE — Telephone Encounter (Signed)
See below

## 2021-07-19 NOTE — Telephone Encounter (Signed)
LAST APPOINTMENT DATE: 03/22/2021  NEXT APPOINTMENT DATE: Visit date not found    LAST REFILL: 06/11/2021  QTY: 30

## 2021-07-19 NOTE — Telephone Encounter (Signed)
Laura King from [employer] called stating that they are needing clarification on the Adderall prescription. She stated the instructions needs to say take 1 tablet every 8 hours or when Dr Durene Cal would like her to take the medication. She stated the prescription needs to be E-Scribed. Please Advise.

## 2021-07-20 MED ORDER — AMPHETAMINE-DEXTROAMPHETAMINE 10 MG PO TABS: ORAL_TABLET | ORAL | 0 refills | Status: DC

## 2021-07-20 NOTE — Telephone Encounter (Signed)
I sent in a new prescription-see if this is adequate

## 2021-08-19 ENCOUNTER — Other Ambulatory Visit: Payer: Self-pay | Admitting: Family Medicine

## 2021-08-19 MED ORDER — AMPHETAMINE-DEXTROAMPHET ER 20 MG PO CP24: 20.0000 mg | ORAL_CAPSULE | Freq: Every day | ORAL | 0 refills | Status: DC

## 2021-08-19 MED ORDER — AMPHETAMINE-DEXTROAMPHETAMINE 10 MG PO TABS: ORAL_TABLET | ORAL | 0 refills | Status: DC

## 2021-09-19 ENCOUNTER — Other Ambulatory Visit: Payer: Self-pay | Admitting: Family Medicine

## 2021-09-19 NOTE — Telephone Encounter (Signed)
Please check with the pharmacy-I am not sure what the issue is

## 2021-09-20 MED ORDER — AMPHETAMINE-DEXTROAMPHET ER 20 MG PO CP24: 20.0000 mg | ORAL_CAPSULE | ORAL | 0 refills | Status: DC

## 2021-10-28 ENCOUNTER — Other Ambulatory Visit: Payer: Self-pay | Admitting: Family Medicine

## 2021-10-28 ENCOUNTER — Telehealth: Payer: Self-pay | Admitting: Family Medicine

## 2021-10-28 NOTE — Telephone Encounter (Signed)
Pt is needing removal and replacement of Nexplanon. I advised her to check with her insurance first to make sure it is covered and if Dr Mardelle Matte is covered also before it can be ordered. Once she receives approval, then we can order and schedule her appt. ?

## 2021-10-28 NOTE — Telephone Encounter (Signed)
FYI

## 2021-10-28 NOTE — Telephone Encounter (Signed)
Last visit: 06/27/21 ? ?Next visit: n/a ? ?Last filled: 09/20/21 20 mg and 08/19/21 for 10mg  ? ?Quantity: 24 10 mg/ 30 20mg   ?

## 2021-10-28 NOTE — Telephone Encounter (Signed)
Last visit ICU with [place] was in August-I am willing to refill but she needs to be scheduled before September for next visit-preferably within a month ?
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
## 2021-11-01 MED ORDER — AMPHETAMINE-DEXTROAMPHETAMINE 10 MG PO TABS: ORAL_TABLET | ORAL | 0 refills | Status: DC

## 2021-11-01 MED ORDER — AMPHETAMINE-DEXTROAMPHET ER 20 MG PO CP24: 20.0000 mg | ORAL_CAPSULE | ORAL | 0 refills | Status: DC

## 2021-12-15 ENCOUNTER — Ambulatory Visit (INDEPENDENT_AMBULATORY_CARE_PROVIDER_SITE_OTHER): Payer: 59 | Admitting: Family Medicine

## 2021-12-15 ENCOUNTER — Encounter: Payer: Self-pay | Admitting: Family Medicine

## 2021-12-15 VITALS — BP 100/80 | HR 52 | Temp 97.9°F | Ht 62.0 in | Wt 140.8 lb

## 2021-12-15 DIAGNOSIS — F988 Other specified behavioral and emotional disorders with onset usually occurring in childhood and adolescence: Secondary | ICD-10-CM | POA: Diagnosis not present

## 2021-12-15 DIAGNOSIS — Z13220 Encounter for screening for lipoid disorders: Secondary | ICD-10-CM

## 2021-12-15 MED ORDER — AMPHETAMINE-DEXTROAMPHETAMINE 10 MG PO TABS: ORAL_TABLET | ORAL | 0 refills | Status: AC

## 2021-12-15 MED ORDER — AMPHETAMINE-DEXTROAMPHET ER 20 MG PO CP24
20.0000 mg | ORAL_CAPSULE | ORAL | 0 refills | Status: DC
Start: 2021-12-15 — End: 2022-01-24

## 2021-12-15 MED ORDER — AMPHETAMINE-DEXTROAMPHET ER 20 MG PO CP24: 20.0000 mg | ORAL_CAPSULE | ORAL | 0 refills | Status: DC

## 2021-12-15 NOTE — Patient Instructions (Addendum)
Mychart [place] most recent COVID vaccine date.  ? ?Please if you do not see GYN and you want [place] to do pap- remind [place] at physical ? ?Recommended follow up: Return in about 6 months (around 06/16/2022) for physical or sooner if needed.Schedule b4 you leave.  ?- can cancel September visit and push back about a month if you would like  ?

## 2022-01-18 ENCOUNTER — Encounter: Payer: Self-pay | Admitting: Family Medicine

## 2022-01-24 MED ORDER — AMPHETAMINE-DEXTROAMPHETAMINE 20 MG PO TABS: 20.0000 mg | ORAL_TABLET | Freq: Every day | ORAL | 0 refills | Status: AC

## 2022-03-07 ENCOUNTER — Ambulatory Visit (INDEPENDENT_AMBULATORY_CARE_PROVIDER_SITE_OTHER): Payer: 59 | Admitting: Family Medicine

## 2022-03-07 ENCOUNTER — Encounter: Payer: Self-pay | Admitting: Family Medicine

## 2022-03-07 VITALS — BP 108/70 | HR 95 | Temp 98.3°F | Ht 62.0 in | Wt 141.2 lb

## 2022-03-07 DIAGNOSIS — F988 Other specified behavioral and emotional disorders with onset usually occurring in childhood and adolescence: Secondary | ICD-10-CM | POA: Diagnosis not present

## 2022-03-07 DIAGNOSIS — J029 Acute pharyngitis, unspecified: Secondary | ICD-10-CM | POA: Diagnosis not present

## 2022-03-07 LAB — POCT RAPID STREP A (OFFICE): Rapid Strep A Screen: NEGATIVE

## 2022-03-07 NOTE — Progress Notes (Signed)
Phone [phone] In person visit   Subjective:   Laura King is a 31 y.o. year old very pleasant adult patient who presents for/with See problem oriented charting Chief Complaint  Patient presents with   Sore Throat    Pt c/o sore throat and she has been exposed to a lot of fungus that started last week.   Past Medical History-  Patient Active Problem List   Diagnosis Date Noted   Attention deficit disorder (ADD) in adult     Priority: Medium    Irregular periods/menstrual cycles 11/16/2014    Priority: Low   Family history of breast cancer 11/16/2014    Priority: Low   ADHD (attention deficit hyperactivity disorder) 03/23/2021   Arthritis 03/23/2021   Dysmenorrhea 03/23/2021   History of concussion 03/23/2021   Menorrhagia 03/23/2021   Status post laparoscopy 03/29/2017    Medications- reviewed and updated Current Outpatient Medications  Medication Sig Dispense Refill   amphetamine-dextroamphetamine (ADDERALL) 10 MG tablet Take 1 tablet daily as needed 8 hours after the morning dose up to 5 days a week. May fill today 24 tablet 0   amphetamine-dextroamphetamine (ADDERALL) 10 MG tablet Take 1 tablet daily as needed 8 hours after the morning dose up to 5 days a week. May fill in 1 month 24 tablet 0   amphetamine-dextroamphetamine (ADDERALL) 10 MG tablet Take 1 tablet daily as needed 8 hours after the morning dose up to 5 days a week. May fill in 2 months 24 tablet 0   amphetamine-dextroamphetamine (ADDERALL) 20 MG tablet Take 1 tablet (20 mg total) by mouth daily before breakfast. 30 tablet 0   [START ON 03/25/2022] amphetamine-dextroamphetamine (ADDERALL) 20 MG tablet Take 1 tablet (20 mg total) by mouth daily before breakfast. 30 tablet 0   etonogestrel (NEXPLANON) 68 MG IMPL implant 1 each by Subdermal route once.     amphetamine-dextroamphetamine (ADDERALL) 20 MG tablet Take 1 tablet (20 mg total) by mouth daily before breakfast. 30 tablet 0   No current  facility-administered medications for this visit.     Objective:  BP 108/70   Pulse 95   Temp 98.3 F (36.8 C)   Ht 5\' 2"  (1.575 m)   Wt 141 lb 3.2 oz (64 kg)   SpO2 98%   BMI 25.83 kg/m  Gen: NAD, resting comfortably Tender lymphadenopathy-anterior cervical.  Pharynx erythematous but without tonsillar edema or exudate. 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 Day 9 today. Mom's coming down tomorrow and has copd so wants to be cautious. Has had felt feverish and tired but working very hard- has not had known fever. Home covid test negative on day 8.  -salt water gargles taking some - trying to staying off meds otherwise- other than cough drops -  reports exposure to mold/fungus A/P: Suspect viral upper respiratory/pharyngitis.  Home COVID test negative.  In office strep negative and Centor criteria of 1 with some tender lymphadenopathy -Discussed possible prednisone but she would like to hold off   # ADD S:control: Reasonable medication:  Adderall 20 mg extended release (when able to find but recently only able to find short acting with pharmacyies)-has a separate 10 mg instant release tablet available if she has prolonged work hours that she can take after extended release dose (or instant release 10 mg) Controlled substance contract: 04/07/2016   NCCSRS/PDMP reviewed:  low risk trend in the past- did not check again Original diagnosis: [employer] Dr. Darci Current well (gave printed copy of this today for travel) - has nexplanon if needed in regards to birth control   A/P: ADD reasonably well controlled-she is going to be moving to [place]-I am willing to provide prescriptions for up to 6 months from today as she seeks to establish care with new physician if [place] will accept prescription - Please note if the future physician is reading this-has had to use multiple pharmacies and change medications multiple times due to availability issues with Adderall-patient has been diligent about follow-ups and always willing to do urine drug screens (most recently august 2022 possitive only for amphetamines as anticipated)  Recommended follow up: likely will need to cancel visit Future Appointments  Date Time Provider Department Center  06/15/2022  1:00 PM Shelva Majestic, MD [facility]    Lab/Order associations:   ICD-10-CM   1. Sore throat  J02.9 POCT rapid strep A    2. Attention deficit disorder (ADD) in adult  F98.8      Return precautions advised.  Tana Conch, MD

## 2022-03-07 NOTE — Patient Instructions (Addendum)
I wish you the best with your upcoming move and new job- congratulations but also I hate what you are having to go through doing this all so quickly.   I think you had a virus and is slowly improving. We could try prednisone but will hold off per preference. Strep test negative. Still let us know if new or worsening symptoms  Let us know when you get established with new doctor in [place]  Thank you for opportunity to be your doc for these few years and we will miss you!

## 2022-05-12 ENCOUNTER — Encounter: Payer: Self-pay | Admitting: Family Medicine

## 2022-05-15 ENCOUNTER — Encounter: Payer: Self-pay | Admitting: *Deleted

## 2022-06-15 ENCOUNTER — Encounter: Payer: Self-pay | Admitting: Family Medicine

## 2022-08-03 ENCOUNTER — Encounter: Payer: Self-pay | Admitting: *Deleted
